# Patient Record
Sex: Male | Born: 1980 | Race: White | Hispanic: No | Marital: Married | Smoking: Never smoker
Health system: Southern US, Community
[De-identification: ages and names within clinical notes are randomized; demographics above are authoritative.]

## PROBLEM LIST (undated history)

## (undated) DIAGNOSIS — K6389 Other specified diseases of intestine: Secondary | ICD-10-CM

## (undated) DIAGNOSIS — T7840XA Allergy, unspecified, initial encounter: Secondary | ICD-10-CM

## (undated) DIAGNOSIS — Z8781 Personal history of (healed) traumatic fracture: Secondary | ICD-10-CM

## (undated) HISTORY — DX: Allergy, unspecified, initial encounter: T78.40XA

## (undated) HISTORY — DX: Personal history of (healed) traumatic fracture: Z87.81

## (undated) HISTORY — PX: VASECTOMY: SHX75

---
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
## 2003-09-25 ENCOUNTER — Emergency Department (HOSPITAL_COMMUNITY): Admission: EM | Admit: 2003-09-25 | Discharge: 2003-09-25 | Payer: Self-pay | Admitting: Emergency Medicine

## 2011-10-26 DIAGNOSIS — Z8781 Personal history of (healed) traumatic fracture: Secondary | ICD-10-CM

## 2011-10-26 HISTORY — PX: FRACTURE SURGERY: SHX138

## 2011-10-26 HISTORY — DX: Personal history of (healed) traumatic fracture: Z87.81

## 2012-03-02 ENCOUNTER — Emergency Department (HOSPITAL_COMMUNITY): Payer: 59

## 2012-03-02 ENCOUNTER — Encounter (HOSPITAL_COMMUNITY): Payer: Self-pay | Admitting: *Deleted

## 2012-03-02 ENCOUNTER — Emergency Department (HOSPITAL_COMMUNITY)
Admission: EM | Admit: 2012-03-02 | Discharge: 2012-03-03 | Disposition: A | Discharge status: Home / Self Care | Payer: 59 | Attending: Emergency Medicine | Admitting: Emergency Medicine

## 2012-03-02 DIAGNOSIS — M25559 Pain in unspecified hip: Secondary | ICD-10-CM | POA: Insufficient documentation

## 2012-03-02 DIAGNOSIS — IMO0002 Reserved for concepts with insufficient information to code with codable children: Secondary | ICD-10-CM | POA: Insufficient documentation

## 2012-03-02 DIAGNOSIS — R0602 Shortness of breath: Secondary | ICD-10-CM | POA: Insufficient documentation

## 2012-03-02 DIAGNOSIS — M25519 Pain in unspecified shoulder: Secondary | ICD-10-CM | POA: Insufficient documentation

## 2012-03-02 DIAGNOSIS — S42002A Fracture of unspecified part of left clavicle, initial encounter for closed fracture: Secondary | ICD-10-CM

## 2012-03-02 DIAGNOSIS — S42023A Displaced fracture of shaft of unspecified clavicle, initial encounter for closed fracture: Secondary | ICD-10-CM | POA: Insufficient documentation

## 2012-03-02 DIAGNOSIS — T07XXXA Unspecified multiple injuries, initial encounter: Secondary | ICD-10-CM

## 2012-03-02 LAB — POCT I-STAT, CHEM 8
BUN: 18 mg/dL (ref 6–23)
Calcium, Ion: 1.15 mmol/L (ref 1.12–1.32)
Chloride: 104 mEq/L (ref 96–112)
Creatinine, Ser: 1.2 mg/dL (ref 0.50–1.35)
Glucose, Bld: 116 mg/dL — ABNORMAL HIGH (ref 70–99)
HCT: 47 % (ref 39.0–52.0)
Hemoglobin: 16 g/dL (ref 13.0–17.0)
Potassium: 4.5 mEq/L (ref 3.5–5.1)
Sodium: 139 mEq/L (ref 135–145)
TCO2: 26 mmol/L (ref 0–100)

## 2012-03-02 LAB — SAMPLE TO BLOOD BANK

## 2012-03-02 MED ORDER — HYDROCODONE-ACETAMINOPHEN 5-325 MG PO TABS: 1.0000 | ORAL_TABLET | ORAL | Status: AC | PRN

## 2012-03-02 MED ORDER — TETANUS-DIPHTH-ACELL PERTUSSIS 5-2.5-18.5 LF-MCG/0.5 IM SUSP
0.5000 mL | Freq: Once | INTRAMUSCULAR | Status: AC
  Administered 2012-03-02: 0.5 mL via INTRAMUSCULAR
  Filled 2012-03-02: qty 0.5

## 2012-03-02 MED ORDER — MORPHINE SULFATE 4 MG/ML IJ SOLN
4.0000 mg | INTRAMUSCULAR | Status: AC
  Administered 2012-03-02: 4 mg via INTRAVENOUS
  Filled 2012-03-02: qty 1

## 2012-03-02 MED ORDER — SODIUM CHLORIDE 0.9 % IV BOLUS (SEPSIS)
1000.0000 mL | INTRAVENOUS | Status: AC
  Administered 2012-03-02: 1000 mL via INTRAVENOUS

## 2012-03-02 MED ORDER — HYDROCODONE-ACETAMINOPHEN 5-325 MG PO TABS
1.0000 | ORAL_TABLET | ORAL | Status: AC
  Administered 2012-03-02: 1 via ORAL
  Filled 2012-03-02: qty 1
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
## 2012-03-02 NOTE — ED Notes (Signed)
Pt with syncopal episode in xray. Pt lifted left arm for xray and states had extreme pain. RN was called to xray and pt was alert and oriented x 3. Staff states pt did not fall to the ground. Pulse 69, pt mildly diaphoretic, blood pressure 110/75. RES made aware.
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
## 2012-03-02 NOTE — ED Notes (Signed)
Ortho on their way 

## 2012-03-02 NOTE — Progress Notes (Signed)
Chaplain Note:  Chaplain responded to LV2 trauma page received at 15:20.  Chaplain provided spiritual comfort and support for pt.  At pt's request, chaplain phoned pt's father to inform him that pt was being treated at [facility].  Pt expressed appreciation for chaplain support.  Chaplain will follow up as needed.  03/02/12 1600  Clinical Encounter Type  Visited With Patient  Visit Type Spiritual support;Initial;ED  Referral From Other (Comment) (Trauma Page)  Spiritual Encounters  Spiritual Needs Emotional  Stress Factors  Patient Stress Factors Health changes  Family Stress Factors Lack of knowledge;Loss of control   Verdie Shire, chaplain resident [phone]

## 2012-03-02 NOTE — Progress Notes (Signed)
Orthopedic Tech Progress Note Patient Details:  William Livingston 03/10/1981 [id number]  Other Ortho Devices Type of Ortho Device: Other (comment) (shoulder immobilizer) Ortho Device Location: (L) UE Ortho Device Interventions: Application   Jennye Moccasin 03/02/2012, 6:19 PM

## 2012-03-05 NOTE — ED Provider Notes (Signed)
I saw and evaluated the patient, reviewed the resident's note and I agree with the findings and plan.   Dione Booze, MD 03/05/12 [phone]

## 2013-07-12 ENCOUNTER — Ambulatory Visit: Payer: PRIVATE HEALTH INSURANCE | Admitting: Physician Assistant

## 2013-07-12 VITALS — BP 110/70 | HR 57 | Temp 97.9°F | Resp 16 | Ht 71.0 in | Wt 173.0 lb

## 2013-07-12 DIAGNOSIS — S51809A Unspecified open wound of unspecified forearm, initial encounter: Secondary | ICD-10-CM

## 2013-07-12 DIAGNOSIS — S51801A Unspecified open wound of right forearm, initial encounter: Secondary | ICD-10-CM
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
## 2013-07-12 NOTE — Patient Instructions (Signed)
Wash the wound daily with soap and water. Return for re-evaluation if you develop increased pain, redness, swelling or drainage from the wound, or if you develop a fever above 101.

## 2014-04-24 ENCOUNTER — Telehealth: Payer: Self-pay

## 2014-04-24 NOTE — Telephone Encounter (Signed)
Patient called yesterday and left a message regarding his records that he requested. I returned patient's call and advised him that his request was processed on Friday 6/25 and that the records were mailed to him. Patient understood.

## 2019-06-26 DIAGNOSIS — K6389 Other specified diseases of intestine: Secondary | ICD-10-CM

## 2019-06-26 HISTORY — DX: Other specified diseases of intestine: K63.89

## 2019-07-17 ENCOUNTER — Encounter (HOSPITAL_COMMUNITY): Payer: Self-pay

## 2019-07-17 ENCOUNTER — Other Ambulatory Visit: Payer: Self-pay

## 2019-07-17 ENCOUNTER — Emergency Department (HOSPITAL_COMMUNITY): Payer: Managed Care, Other (non HMO)

## 2019-07-17 ENCOUNTER — Inpatient Hospital Stay (HOSPITAL_COMMUNITY)
Admission: EM | Admit: 2019-07-17 | Discharge: 2019-07-31 | DRG: 330 | Disposition: A | Discharge status: Home / Self Care | Payer: Managed Care, Other (non HMO) | Attending: Surgery | Admitting: Surgery

## 2019-07-17 DIAGNOSIS — D649 Anemia, unspecified: Secondary | ICD-10-CM

## 2019-07-17 DIAGNOSIS — K9189 Other postprocedural complications and disorders of digestive system: Secondary | ICD-10-CM | POA: Diagnosis not present

## 2019-07-17 DIAGNOSIS — R5383 Other fatigue: Secondary | ICD-10-CM | POA: Diagnosis present

## 2019-07-17 DIAGNOSIS — K6389 Other specified diseases of intestine: Secondary | ICD-10-CM

## 2019-07-17 DIAGNOSIS — K621 Rectal polyp: Secondary | ICD-10-CM | POA: Diagnosis present

## 2019-07-17 DIAGNOSIS — Z20828 Contact with and (suspected) exposure to other viral communicable diseases: Secondary | ICD-10-CM | POA: Diagnosis present

## 2019-07-17 DIAGNOSIS — K56600 Partial intestinal obstruction, unspecified as to cause: Secondary | ICD-10-CM | POA: Diagnosis present

## 2019-07-17 DIAGNOSIS — Z539 Procedure and treatment not carried out, unspecified reason: Secondary | ICD-10-CM | POA: Diagnosis present

## 2019-07-17 DIAGNOSIS — C183 Malignant neoplasm of hepatic flexure: Secondary | ICD-10-CM | POA: Diagnosis present

## 2019-07-17 DIAGNOSIS — R000 Tachycardia, unspecified: Secondary | ICD-10-CM | POA: Diagnosis present

## 2019-07-17 DIAGNOSIS — Z5331 Laparoscopic surgical procedure converted to open procedure: Secondary | ICD-10-CM | POA: Diagnosis not present

## 2019-07-17 DIAGNOSIS — R590 Localized enlarged lymph nodes: Secondary | ICD-10-CM | POA: Diagnosis present

## 2019-07-17 DIAGNOSIS — K567 Ileus, unspecified: Secondary | ICD-10-CM | POA: Diagnosis not present

## 2019-07-17 DIAGNOSIS — K8020 Calculus of gallbladder without cholecystitis without obstruction: Secondary | ICD-10-CM | POA: Diagnosis present

## 2019-07-17 DIAGNOSIS — R339 Retention of urine, unspecified: Secondary | ICD-10-CM | POA: Diagnosis present

## 2019-07-17 DIAGNOSIS — D62 Acute posthemorrhagic anemia: Secondary | ICD-10-CM | POA: Diagnosis present

## 2019-07-17 DIAGNOSIS — K648 Other hemorrhoids: Secondary | ICD-10-CM | POA: Diagnosis present

## 2019-07-17 DIAGNOSIS — Z881 Allergy status to other antibiotic agents status: Secondary | ICD-10-CM | POA: Diagnosis not present

## 2019-07-17 DIAGNOSIS — D509 Iron deficiency anemia, unspecified: Secondary | ICD-10-CM | POA: Diagnosis present

## 2019-07-17 DIAGNOSIS — R140 Abdominal distension (gaseous): Secondary | ICD-10-CM

## 2019-07-17 DIAGNOSIS — R911 Solitary pulmonary nodule: Secondary | ICD-10-CM | POA: Diagnosis present

## 2019-07-17 DIAGNOSIS — R42 Dizziness and giddiness: Secondary | ICD-10-CM | POA: Diagnosis present

## 2019-07-17 DIAGNOSIS — C182 Malignant neoplasm of ascending colon: Principal | ICD-10-CM | POA: Diagnosis present

## 2019-07-17 DIAGNOSIS — I951 Orthostatic hypotension: Secondary | ICD-10-CM

## 2019-07-17 LAB — COMPREHENSIVE METABOLIC PANEL
ALT: 13 U/L (ref 0–44)
AST: 15 U/L (ref 15–41)
Albumin: 3.5 g/dL (ref 3.5–5.0)
Alkaline Phosphatase: 76 U/L (ref 38–126)
Anion gap: 13 (ref 5–15)
BUN: 8 mg/dL (ref 6–20)
CO2: 24 mmol/L (ref 22–32)
Calcium: 8.9 mg/dL (ref 8.9–10.3)
Chloride: 99 mmol/L (ref 98–111)
Creatinine, Ser: 0.95 mg/dL (ref 0.61–1.24)
GFR calc Af Amer: >60 mL/min (ref >60)
GFR calc non Af Amer: >60 mL/min (ref >60)
Glucose, Bld: 117 mg/dL — ABNORMAL HIGH (ref 70–99)
Potassium: 4.3 mmol/L (ref 3.5–5.1)
Sodium: 136 mmol/L (ref 135–145)
Total Bilirubin: 0.7 mg/dL (ref 0.3–1.2)
Total Protein: 7.2 g/dL (ref 6.5–8.1)

## 2019-07-17 LAB — FERRITIN: Ferritin: 12 ng/mL — ABNORMAL LOW (ref 24–336)

## 2019-07-17 LAB — URINALYSIS, ROUTINE W REFLEX MICROSCOPIC
Bilirubin Urine: NEGATIVE
Glucose, UA: NEGATIVE mg/dL
Hgb urine dipstick: NEGATIVE
Ketones, ur: 20 mg/dL — AB
Leukocytes,Ua: NEGATIVE
Nitrite: NEGATIVE
Protein, ur: NEGATIVE mg/dL
Specific Gravity, Urine: >1.046 — ABNORMAL HIGH (ref 1.005–1.030)
pH: 6 (ref 5.0–8.0)

## 2019-07-17 LAB — CBC WITH DIFFERENTIAL/PLATELET
Abs Immature Granulocytes: 0.08 10*3/uL — ABNORMAL HIGH (ref 0.00–0.07)
Basophils Absolute: 0 10*3/uL (ref 0.0–0.1)
Basophils Relative: 0 %
Eosinophils Absolute: 0.1 10*3/uL (ref 0.0–0.5)
Eosinophils Relative: 0 %
HCT: 29.6 % — ABNORMAL LOW (ref 39.0–52.0)
Hemoglobin: 8.3 g/dL — ABNORMAL LOW (ref 13.0–17.0)
Immature Granulocytes: 1 %
Lymphocytes Relative: 6 %
Lymphs Abs: 0.8 10*3/uL (ref 0.7–4.0)
MCH: 17.2 pg — ABNORMAL LOW (ref 26.0–34.0)
MCHC: 28 g/dL — ABNORMAL LOW (ref 30.0–36.0)
MCV: 61.4 fL — ABNORMAL LOW (ref 80.0–100.0)
Monocytes Absolute: 1.2 10*3/uL — ABNORMAL HIGH (ref 0.1–1.0)
Monocytes Relative: 10 %
Neutro Abs: 9.9 10*3/uL — ABNORMAL HIGH (ref 1.7–7.7)
Neutrophils Relative %: 83 %
Platelets: 656 10*3/uL — ABNORMAL HIGH (ref 150–400)
RBC: 4.82 MIL/uL (ref 4.22–5.81)
RDW: 18.1 % — ABNORMAL HIGH (ref 11.5–15.5)
WBC: 12 10*3/uL — ABNORMAL HIGH (ref 4.0–10.5)
nRBC: 0 % (ref 0.0–0.2)

## 2019-07-17 LAB — CBC
HCT: 30.8 % — ABNORMAL LOW (ref 39.0–52.0)
Hemoglobin: 8.6 g/dL — ABNORMAL LOW (ref 13.0–17.0)
MCH: 17.2 pg — ABNORMAL LOW (ref 26.0–34.0)
MCHC: 27.9 g/dL — ABNORMAL LOW (ref 30.0–36.0)
MCV: 61.6 fL — ABNORMAL LOW (ref 80.0–100.0)
Platelets: 673 10*3/uL — ABNORMAL HIGH (ref 150–400)
RBC: 5 MIL/uL (ref 4.22–5.81)
RDW: 18.4 % — ABNORMAL HIGH (ref 11.5–15.5)
WBC: 12 10*3/uL — ABNORMAL HIGH (ref 4.0–10.5)
nRBC: 0 % (ref 0.0–0.2)

## 2019-07-17 LAB — RETICULOCYTES
Immature Retic Fract: 17.7 % — ABNORMAL HIGH (ref 2.3–15.9)
RBC.: 4.82 MIL/uL (ref 4.22–5.81)
Retic Count, Absolute: 53 10*3/uL (ref 19.0–186.0)
Retic Ct Pct: 1.1 % (ref 0.4–3.1)

## 2019-07-17 LAB — IRON AND TIBC
Iron: 6 ug/dL — ABNORMAL LOW (ref 45–182)
Saturation Ratios: 2 % — ABNORMAL LOW (ref 17.9–39.5)
TIBC: 349 ug/dL (ref 250–450)
UIBC: 343 ug/dL

## 2019-07-17 LAB — ABO/RH: ABO/RH(D): A POS

## 2019-07-17 LAB — FOLATE: Folate: 17.4 ng/mL (ref >5.9)

## 2019-07-17 LAB — PROTIME-INR
INR: 1.1 (ref 0.8–1.2)
Prothrombin Time: 14.5 seconds (ref 11.4–15.2)

## 2019-07-17 LAB — LIPASE, BLOOD: Lipase: 20 U/L (ref 11–51)

## 2019-07-17 LAB — LACTIC ACID, PLASMA: Lactic Acid, Venous: 1 mmol/L (ref 0.5–1.9)

## 2019-07-17 LAB — POC OCCULT BLOOD, ED: Fecal Occult Bld: NEGATIVE

## 2019-07-17 LAB — VITAMIN B12: Vitamin B-12: 120 pg/mL — ABNORMAL LOW (ref 180–914)

## 2019-07-17 LAB — SARS CORONAVIRUS 2 BY RT PCR (HOSPITAL ORDER, PERFORMED IN ~~LOC~~ HOSPITAL LAB): SARS Coronavirus 2: NEGATIVE

## 2019-07-17 MED ORDER — ACETAMINOPHEN 650 MG RE SUPP: 650.0000 mg | Freq: Four times a day (QID) | RECTAL | Status: DC | PRN

## 2019-07-17 MED ORDER — IOHEXOL 300 MG/ML  SOLN
100.0000 mL | Freq: Once | INTRAMUSCULAR | Status: AC | PRN
  Administered 2019-07-17: 15:00:00 100 mL via INTRAVENOUS

## 2019-07-17 MED ORDER — PIPERACILLIN-TAZOBACTAM 3.375 G IVPB
3.3750 g | Freq: Three times a day (TID) | INTRAVENOUS | Status: DC
  Administered 2019-07-18 – 2019-07-20 (×8): 3.375 g via INTRAVENOUS
  Filled 2019-07-17 (×8): qty 50

## 2019-07-17 MED ORDER — ONDANSETRON 4 MG PO TBDP: 4.0000 mg | ORAL_TABLET | Freq: Four times a day (QID) | ORAL | Status: DC | PRN

## 2019-07-17 MED ORDER — PIPERACILLIN-TAZOBACTAM 3.375 G IVPB 30 MIN
3.3750 g | Freq: Once | INTRAVENOUS | Status: AC
  Administered 2019-07-17: 20:00:00 3.375 g via INTRAVENOUS
  Filled 2019-07-17: qty 50

## 2019-07-17 MED ORDER — SODIUM CHLORIDE 0.9 % IV BOLUS
1000.0000 mL | Freq: Once | INTRAVENOUS | Status: AC
  Administered 2019-07-17: 1000 mL via INTRAVENOUS

## 2019-07-17 MED ORDER — HYDROMORPHONE HCL 1 MG/ML IJ SOLN
0.5000 mg | INTRAMUSCULAR | Status: DC | PRN
  Administered 2019-07-17 – 2019-07-23 (×16): 0.5 mg via INTRAVENOUS
  Filled 2019-07-17 (×16): qty 1

## 2019-07-17 MED ORDER — ONDANSETRON HCL 4 MG/2ML IJ SOLN
4.0000 mg | Freq: Four times a day (QID) | INTRAMUSCULAR | Status: DC | PRN
  Administered 2019-07-27 – 2019-07-28 (×4): 4 mg via INTRAVENOUS
  Filled 2019-07-17 (×4): qty 2

## 2019-07-17 MED ORDER — SODIUM CHLORIDE 0.9 % IV BOLUS
1000.0000 mL | Freq: Once | INTRAVENOUS | Status: AC
  Administered 2019-07-17: 13:00:00 1000 mL via INTRAVENOUS

## 2019-07-17 MED ORDER — ENOXAPARIN SODIUM 40 MG/0.4ML ~~LOC~~ SOLN
40.0000 mg | SUBCUTANEOUS | Status: DC
  Administered 2019-07-19 – 2019-07-31 (×12): 40 mg via SUBCUTANEOUS
  Filled 2019-07-17 (×14): qty 0.4

## 2019-07-17 MED ORDER — ACETAMINOPHEN 325 MG PO TABS: 650.0000 mg | ORAL_TABLET | Freq: Four times a day (QID) | ORAL | Status: DC | PRN

## 2019-07-17 MED ORDER — SODIUM CHLORIDE 0.9 % IV SOLN
INTRAVENOUS | Status: DC
  Administered 2019-07-18 – 2019-07-21 (×6): via INTRAVENOUS

## 2019-07-17 MED ORDER — SIMETHICONE 80 MG PO CHEW
40.0000 mg | CHEWABLE_TABLET | Freq: Four times a day (QID) | ORAL | Status: DC | PRN
  Filled 2019-07-17: qty 1

## 2019-07-17 NOTE — ED Triage Notes (Signed)
Pt endorses abd pain for several months, went to GI yesterday and found to have low hgb 7.6. Pt is fatigued, no blood in stool denies n/v/d. HR 142 in triage. Axox4.
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
## 2019-07-18 ENCOUNTER — Encounter (HOSPITAL_COMMUNITY): Payer: Self-pay | Admitting: General Practice

## 2019-07-18 LAB — CBC
HCT: 25.1 % — ABNORMAL LOW (ref 39.0–52.0)
HCT: 25.2 % — ABNORMAL LOW (ref 39.0–52.0)
Hemoglobin: 7 g/dL — ABNORMAL LOW (ref 13.0–17.0)
Hemoglobin: 6.9 g/dL — CL (ref 13.0–17.0)
MCH: 17.1 pg — ABNORMAL LOW (ref 26.0–34.0)
MCH: 16.7 pg — ABNORMAL LOW (ref 26.0–34.0)
MCHC: 27.9 g/dL — ABNORMAL LOW (ref 30.0–36.0)
MCHC: 27.4 g/dL — ABNORMAL LOW (ref 30.0–36.0)
MCV: 61.4 fL — ABNORMAL LOW (ref 80.0–100.0)
MCV: 61.2 fL — ABNORMAL LOW (ref 80.0–100.0)
Platelets: 561 10*3/uL — ABNORMAL HIGH (ref 150–400)
Platelets: 357 10*3/uL (ref 150–400)
RBC: 4.09 MIL/uL — ABNORMAL LOW (ref 4.22–5.81)
RBC: 4.12 MIL/uL — ABNORMAL LOW (ref 4.22–5.81)
RDW: 17.9 % — ABNORMAL HIGH (ref 11.5–15.5)
RDW: 18 % — ABNORMAL HIGH (ref 11.5–15.5)
WBC: 9.8 10*3/uL (ref 4.0–10.5)
WBC: 9.3 10*3/uL (ref 4.0–10.5)
nRBC: 0 % (ref 0.0–0.2)
nRBC: 0 % (ref 0.0–0.2)

## 2019-07-18 LAB — BASIC METABOLIC PANEL
Anion gap: 10 (ref 5–15)
BUN: 8 mg/dL (ref 6–20)
CO2: 23 mmol/L (ref 22–32)
Calcium: 8 mg/dL — ABNORMAL LOW (ref 8.9–10.3)
Chloride: 101 mmol/L (ref 98–111)
Creatinine, Ser: 0.91 mg/dL (ref 0.61–1.24)
GFR calc Af Amer: >60 mL/min (ref >60)
GFR calc non Af Amer: >60 mL/min (ref >60)
Glucose, Bld: 107 mg/dL — ABNORMAL HIGH (ref 70–99)
Potassium: 4.1 mmol/L (ref 3.5–5.1)
Sodium: 134 mmol/L — ABNORMAL LOW (ref 135–145)

## 2019-07-18 LAB — PREPARE RBC (CROSSMATCH)

## 2019-07-18 MED ORDER — SODIUM CHLORIDE 0.9% IV SOLUTION
Freq: Once | INTRAVENOUS | Status: AC
  Administered 2019-07-18: 09:00:00 via INTRAVENOUS

## 2019-07-18 MED ORDER — PEG 3350-KCL-NA BICARB-NACL 420 G PO SOLR
4000.0000 mL | Freq: Once | ORAL | Status: AC
  Administered 2019-07-18: 4000 mL via ORAL
  Filled 2019-07-18: qty 4000

## 2019-07-18 NOTE — ED Notes (Signed)
Paged admitting and told them about new cbc values that were 7.0 from 8.6 and pressures were lower , was advised to recollect cbc to confirm the drop. Nothing for pressures at this time.
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
## 2019-07-18 NOTE — ED Notes (Signed)
Attempted report 

## 2019-07-18 NOTE — ED Notes (Signed)
Paged CCs to Rn Angela--William Livingston

## 2019-07-18 NOTE — ED Notes (Signed)
Diet was ordered for Lunch. 

## 2019-07-18 NOTE — ED Notes (Signed)
Admitting doctor paged regarding patients hgb

## 2019-07-18 NOTE — Progress Notes (Signed)
[facility] Progress Note     Subjective: CC-  Comfortable this morning. Mild RUQ discomfort. Denies n/v.  HR stable 62bpm, BP soft 96/50 CBC pending this morning.  Objective: Vital signs in last 24 hours: Temp:  [98.6 F (37 C)-99.1 F (37.3 C)] 99.1 F (37.3 C) (09/23 0158) Pulse Rate:  [62-142] 62 (09/23 0600) Resp:  [14-26] 16 (09/23 0600) BP: (96-148)/(50-86) 96/50 (09/23 0600) SpO2:  [94 %-100 %] 95 % (09/23 0600) Weight:  [84.4 kg] 84.4 kg (09/22 1800)    Intake/Output from previous day: 09/22 0701 - 09/23 0700 In: 1200 [IV Piggyback:1200] Out: -  Intake/Output this shift: Total I/O In: -  Out: 550 [Urine:550]  PE: Gen:  Alert, NAD, pleasant HEENT: EOM's intact, pupils equal and round Card:  RRR Pulm:  CTAB, no W/R/R, effort normal Abd: Soft, ND, +BS, no HSM, mild subjective RUQ TTP without peritonitis Ext: calves soft and nontender without edema Psych: A&Ox3  Skin: no rashes noted, warm and dry  Lab Results:  Recent Labs    07/17/19 1455 07/18/19 0342  WBC 12.0* 9.8  HGB 8.3* 7.0*  HCT 29.6* 25.1*  PLT 656* 561*   BMET Recent Labs    07/17/19 1142 07/18/19 0342  NA 136 134*  K 4.3 4.1  CL 99 101  CO2 24 23  GLUCOSE 117* 107*  BUN 8 8  CREATININE 0.95 0.91  CALCIUM 8.9 8.0*   PT/INR Recent Labs    07/17/19 1142  LABPROT 14.5  INR 1.1   CMP     Component Value Date/Time   NA 134 (L) 07/18/2019 0342   K 4.1 07/18/2019 0342   CL 101 07/18/2019 0342   CO2 23 07/18/2019 0342   GLUCOSE 107 (H) 07/18/2019 0342   BUN 8 07/18/2019 0342   CREATININE 0.91 07/18/2019 0342   CALCIUM 8.0 (L) 07/18/2019 0342   PROT 7.2 07/17/2019 1142   ALBUMIN 3.5 07/17/2019 1142   AST 15 07/17/2019 1142   ALT 13 07/17/2019 1142   ALKPHOS 76 07/17/2019 1142   BILITOT 0.7 07/17/2019 1142   GFRNONAA >60 07/18/2019 0342   GFRAA >60 07/18/2019 0342   Lipase     Component Value Date/Time   LIPASE 20 07/17/2019 1142        Studies/Results: Ct Abdomen Pelvis W Contrast  Result Date: 07/17/2019 CLINICAL DATA:  Abdominal pain EXAM: CT ABDOMEN AND PELVIS WITH CONTRAST TECHNIQUE: Multidetector CT imaging of the abdomen and pelvis was performed using the standard protocol following bolus administration of intravenous contrast. CONTRAST:  168mL OMNIPAQUE IOHEXOL 300 MG/ML  SOLN COMPARISON:  None. FINDINGS: Lower chest: There is bibasilar atelectasis. There is no lung base edema or consolidation. Hepatobiliary: No focal liver lesions are evident. There is cholelithiasis. There is no appreciable gallbladder wall thickening. There is no appreciable biliary duct dilatation. Pancreas: There is no pancreatic mass or inflammatory focus. Spleen: There are probable small hemangiomas in the mid to inferior spleen, largest measuring approximately 1 cm. No other splenic lesions are evident. Adrenals/Urinary Tract: Adrenals bilaterally appear normal. Kidneys bilaterally show no evident mass or hydronephrosis on either side. There is no evident renal or ureteral calculus on either side. Urinary bladder is midline with wall thickness within normal limits. Stomach/Bowel: There is extensive thickening of the wall of the mid ascending colon with extensive mesenteric thickening surrounding this area of thickening. This area in the mid ascending colon appears masslike, measuring 9.8 x 7.9 x 8.7 cm. There is fluid surrounding this  area of the ascending colon. No similar changes are noted elsewhere in the colon. There are several nearby lymph nodes, largest measuring 1.2 x 1.1 cm. No bowel obstruction is evident. There is no free air or portal venous air. The terminal ileum appears unremarkable. Vascular/Lymphatic: There is no abdominal aortic aneurysm. No vascular lesions are evident. They are is no adenopathy beyond mildly enlarged lymph nodes near the apparent mass in the ascending colon. Reproductive: Prostate and seminal vesicles appear normal  in size and contour. No pelvic masses evident. Other: The appendix appears unremarkable. There is no appreciable abscess in the abdomen or pelvis. A small amount of ascites is noted in the dependent portion of the pelvis. No other ascites evident. Slight fat is noted in the umbilicus. Musculoskeletal: No blastic or lytic bone lesions. No intramuscular lesion. IMPRESSION: 1. Apparent inflammatory mass arising in the mid ascending colon. This mass appears irregular and contour measuring 9.8 x 7.9 x 8.7 cm. There is irregular wall thickening in the ascending colon with surrounding soft tissue stranding and fluid in several prominent lymph nodes. 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 This area warrants direct visualization to further evaluate. 2. No similar masslike area elsewhere within bowel. No bowel obstruction. Small amount of ascites in pelvis which may well be of reactive etiology. Appendix appears normal. 3.  Cholelithiasis.  No gallbladder wall thickening. 4.  No evident liver lesions. 5.  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 Electronically Signed   By: Lowella Grip III M.D.   On: 07/17/2019 15:29    Anti-infectives: Anti-infectives (From admission, onward)   Start     Dose/Rate Route Frequency Ordered Stop   07/18/19 0130  piperacillin-tazobactam (ZOSYN) IVPB 3.375 g     3.375 g 12.5 mL/hr over 240 Minutes Intravenous Every 8 hours 07/17/19 1853     07/17/19 1900  piperacillin-tazobactam (ZOSYN) IVPB 3.375 g     3.375 g 100 mL/hr over 30 Minutes Intravenous  Once 07/17/19 1850 07/17/19 2327       Assessment/Plan Acute blood loss anemia - likely 2/2 to below. Repeat CBC pending. If Hgb <7 will need blood transfusion  Right colon mass - not sure of etiology but this appears to be neoplasm - CEA pending - continue zosyn - Will ask GI to consult for possible colonoscopy. May be beneficial to evaluate his colon with a colonoscope  prior to surgery to ensure no other lesions in the colon. Would not need a biopsy because he will undergo right colectomy during this admission, and would be at risk of bleeding with a biopsy.  Keep NPO for now.  ID - zosyn 9/22>> FEN - IVF, NPO VTE - SCDs, lovenox Foley - none Follow up - TBD   LOS: 1 day    Wellington Hampshire , [facility] 07/18/2019, 7:56 AM Pager: [phone] Mon-Thurs 7:00 am-4:30 pm Fri 7:00 am -11:30 AM Sat-Sun 7:00 am-11:30 am
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
## 2019-07-18 NOTE — Progress Notes (Signed)
Pt arrived from ED to 6N14 AOx4, VSS, with ongoing Blood transfusion and IV antibiotic, ambulatory, wife at bedside, oriented to room, call bell, call light and use of bed controls. Denies pain at this time, will continue to monitor.

## 2019-07-18 NOTE — ED Notes (Signed)
Dr. Wakefield at bedside. 

## 2019-07-19 ENCOUNTER — Inpatient Hospital Stay (HOSPITAL_COMMUNITY): Payer: Managed Care, Other (non HMO) | Admitting: Certified Registered Nurse Anesthetist

## 2019-07-19 ENCOUNTER — Encounter (HOSPITAL_COMMUNITY): Payer: Self-pay

## 2019-07-19 ENCOUNTER — Encounter (HOSPITAL_COMMUNITY): Admission: EM | Disposition: A | Discharge status: Home / Self Care | Payer: Self-pay | Source: Home / Self Care

## 2019-07-19 ENCOUNTER — Inpatient Hospital Stay (HOSPITAL_COMMUNITY): Payer: Managed Care, Other (non HMO)

## 2019-07-19 HISTORY — PX: POLYPECTOMY: SHX5525

## 2019-07-19 HISTORY — PX: SUBMUCOSAL TATTOO INJECTION: SHX6856

## 2019-07-19 HISTORY — PX: BIOPSY: SHX5522

## 2019-07-19 HISTORY — PX: COLONOSCOPY WITH PROPOFOL: SHX5780

## 2019-07-19 LAB — BASIC METABOLIC PANEL
Anion gap: 9 (ref 5–15)
BUN: 6 mg/dL (ref 6–20)
CO2: 23 mmol/L (ref 22–32)
Calcium: 8.3 mg/dL — ABNORMAL LOW (ref 8.9–10.3)
Chloride: 106 mmol/L (ref 98–111)
Creatinine, Ser: 0.86 mg/dL (ref 0.61–1.24)
GFR calc Af Amer: >60 mL/min (ref >60)
GFR calc non Af Amer: >60 mL/min (ref >60)
Glucose, Bld: 96 mg/dL (ref 70–99)
Potassium: 3.9 mmol/L (ref 3.5–5.1)
Sodium: 138 mmol/L (ref 135–145)

## 2019-07-19 LAB — TYPE AND SCREEN
ABO/RH(D): A POS
Antibody Screen: NEGATIVE
Unit division: 0
Unit division: 0
Unit division: 0

## 2019-07-19 LAB — BPAM RBC
Blood Product Expiration Date: 202010182359
Blood Product Expiration Date: 202010182359
Blood Product Expiration Date: 202010192359
ISSUE DATE / TIME: 202009231024
ISSUE DATE / TIME: 202009231509
ISSUE DATE / TIME: 202009232027
Unit Type and Rh: 6200
Unit Type and Rh: 6200
Unit Type and Rh: 6200

## 2019-07-19 LAB — CBC
HCT: 30.5 % — ABNORMAL LOW (ref 39.0–52.0)
Hemoglobin: 8.9 g/dL — ABNORMAL LOW (ref 13.0–17.0)
MCH: 19.3 pg — ABNORMAL LOW (ref 26.0–34.0)
MCHC: 29.2 g/dL — ABNORMAL LOW (ref 30.0–36.0)
MCV: 66 fL — ABNORMAL LOW (ref 80.0–100.0)
Platelets: 501 10*3/uL — ABNORMAL HIGH (ref 150–400)
RBC: 4.62 MIL/uL (ref 4.22–5.81)
RDW: 23.2 % — ABNORMAL HIGH (ref 11.5–15.5)
WBC: 7.4 10*3/uL (ref 4.0–10.5)
nRBC: 0 % (ref 0.0–0.2)

## 2019-07-19 LAB — CEA: CEA: 1.7 ng/mL (ref 0.0–4.7)

## 2019-07-19 SURGERY — COLONOSCOPY WITH PROPOFOL
Anesthesia: Monitor Anesthesia Care

## 2019-07-19 MED ORDER — LIDOCAINE 2% (20 MG/ML) 5 ML SYRINGE
INTRAMUSCULAR | Status: DC | PRN
  Administered 2019-07-19: 80 mg via INTRAVENOUS

## 2019-07-19 MED ORDER — IOHEXOL 300 MG/ML  SOLN
75.0000 mL | Freq: Once | INTRAMUSCULAR | Status: AC | PRN
  Administered 2019-07-19: 75 mL via INTRAVENOUS

## 2019-07-19 MED ORDER — PROPOFOL 500 MG/50ML IV EMUL
INTRAVENOUS | Status: DC | PRN
  Administered 2019-07-19: 75 ug/kg/min via INTRAVENOUS

## 2019-07-19 MED ORDER — PROPOFOL 10 MG/ML IV BOLUS
INTRAVENOUS | Status: DC | PRN
  Administered 2019-07-19 (×2): 20 mg via INTRAVENOUS

## 2019-07-19 MED ORDER — SPOT INK MARKER SYRINGE KIT
PACK | SUBMUCOSAL | Status: DC | PRN
  Administered 2019-07-19: 3 mL via SUBMUCOSAL

## 2019-07-19 SURGICAL SUPPLY — 22 items

## 2019-07-19 NOTE — Anesthesia Postprocedure Evaluation (Signed)
Anesthesia Post Note  Patient: William Livingston  Procedure(s) Performed: COLONOSCOPY WITH PROPOFOL (N/A ) BIOPSY SUBMUCOSAL TATTOO INJECTION POLYPECTOMY     Patient location during evaluation: PACU Anesthesia Type: MAC Level of consciousness: awake and alert Pain management: pain level controlled Vital Signs Assessment: post-procedure vital signs reviewed and stable Respiratory status: spontaneous breathing, nonlabored ventilation and respiratory function stable Cardiovascular status: stable and blood pressure returned to baseline Anesthetic complications: no    Last Vitals:  Vitals:   07/19/19 1445 07/19/19 1455  BP: (!) 120/57 132/67  Pulse: (!) 52 (!) 51  Resp: 14 18  Temp:    SpO2: 100% 99%    Last Pain:  Vitals:   07/19/19 1455  TempSrc:   PainSc: 0-No pain                 Audry Pili

## 2019-07-19 NOTE — Transfer of Care (Signed)
Immediate Anesthesia Transfer of Care Note  Patient: William Livingston  Procedure(s) Performed: COLONOSCOPY WITH PROPOFOL (N/A ) BIOPSY SUBMUCOSAL TATTOO INJECTION POLYPECTOMY  Patient Location: Endoscopy Unit  Anesthesia Type:MAC  Level of Consciousness: drowsy, patient cooperative and responds to stimulation  Airway & Oxygen Therapy: Patient Spontanous Breathing and Patient connected to face mask oxygen  Post-op Assessment: Report given to RN and Post -op Vital signs reviewed and stable  Post vital signs: Reviewed and stable  Last Vitals:  Vitals Value Taken Time  BP    Temp    Pulse    Resp    SpO2      Last Pain:  Vitals:   07/19/19 1323  TempSrc: Temporal  PainSc: 0-No pain         Complications: No apparent anesthesia complications

## 2019-07-19 NOTE — TOC Initial Note (Signed)
Transition of Care [facility]) - Initial/Assessment Note    Patient Details  Name: William Livingston MRN: [id number] Date of Birth: [birth year]  [facility] CM/SW Contact:    Marilu Favre, RN Phone Number: 07/19/2019, 11:56 AM  Clinical Narrative:                  Spoke to patient and wife at bedside. Confirmed face sheet information.   Patient does not have a PCP. Explained he can call number on his insurance card for a complete list of MD's in network.   Patient plans to call his wife's PCP , Dr Anastasia Pall to make an appointment to establish care .  Will continue to follow for discharge needs.    Barriers to Discharge: Continued Medical Work up   Patient Goals and CMS Choice Patient states their goals for this hospitalization and ongoing recovery are:: to return to home CMS Medicare.gov Compare Post Acute Care list provided to:: Patient    Expected Discharge Plan and Services         Living arrangements for the past 2 months: Single Family Home                 DME Arranged: N/A         HH Arranged: NA          Prior Living Arrangements/Services Living arrangements for the past 2 months: Single Family Home Lives with:: Spouse Patient language and need for interpreter reviewed:: Yes Do you feel safe going back to the place where you live?: Yes      Need for Family Participation in Patient Care: No (Comment) Care giver support system in place?: Yes (comment)   Criminal Activity/Legal Involvement Pertinent to Current Situation/Hospitalization: No - Comment as needed  Activities of Daily Living Home Assistive Devices/Equipment: None ADL Screening (condition at time of admission) Patient's cognitive ability adequate to safely complete daily activities?: Yes Is the patient deaf or have difficulty hearing?: No Does the patient have difficulty seeing, even when wearing glasses/contacts?: No Does the patient have difficulty concentrating,  remembering, or making decisions?: No Patient able to express need for assistance with ADLs?: Yes Does the patient have difficulty dressing or bathing?: No Independently performs ADLs?: Yes (appropriate for developmental age) Does the patient have difficulty walking or climbing stairs?: No Weakness of Legs: None Weakness of Arms/Hands: None  Permission Sought/Granted                  Emotional Assessment Appearance:: Appears stated age Attitude/Demeanor/Rapport: Engaged Affect (typically observed): Accepting Orientation: : Oriented to Self, Oriented to Place, Oriented to  Time, Oriented to Situation Alcohol / Substance Use: Not Applicable Psych Involvement: No (comment)  Admission diagnosis:  Tachycardia [R00.0] Orthostasis [I95.1] Colonic mass [K63.89] Anemia, unspecified type [D64.9] Patient Active Problem List   Diagnosis Date Noted  . Mass of colon 07/17/2019   PCP:  Patient, No Pcp Per Pharmacy:   [facility] [id number], [address] AT [facility] [address] [place] [address] Phone: [phone] Fax: [phone]     Social Determinants of Health ([place]) Interventions    Readmission Risk Interventions No flowsheet data found.

## 2019-07-19 NOTE — Op Note (Signed)
[facility] Patient Name: William Livingston Procedure Date : 07/19/2019 MRN: [id number] Attending MD: Otis Brace , MD Date of Birth: 1981-05-25 CSN: [id number] Age: 38 Admit Type: Inpatient Procedure:                Colonoscopy Indications:              Iron deficiency anemia, Abnormal CT of the GI                            tract- Right colon mass Providers:                Otis Brace, MD, Carlyn Reichert, RN, Marguerita Merles, Technician, Lazaro Arms, Technician Referring MD:              Medicines:                Sedation Administered by an Anesthesia Professional Complications:            No immediate complications. Estimated Blood Loss:     Estimated blood loss was minimal. Procedure:                Pre-Anesthesia Assessment:                           - Prior to the procedure, a History and Physical                            was performed, and patient medications and                            allergies were reviewed. The patient's tolerance of                            previous anesthesia was also reviewed. The risks                            and benefits of the procedure and the sedation                            options and risks were discussed with the patient.                            All questions were answered, and informed consent                            was obtained. Prior Anticoagulants: The patient has                            taken no previous anticoagulant or antiplatelet                            agents. ASA Grade Assessment: I - A normal, healthy  patient. After reviewing the risks and benefits,                            the patient was deemed in satisfactory condition to                            undergo the procedure.                           After obtaining informed consent, the colonoscope                            was passed under direct vision. Throughout the             procedure, the patient's blood pressure, pulse, and                            oxygen saturations were monitored continuously. The                            [id number] Olympus pediatric colonscope                            was introduced through the anus with the intention                            of advancing to the cecum. The scope was advanced                            to the ascending colon before the procedure was                            aborted. Medications were given. The colonoscopy                            was technically difficult and complex due to a                            partially obstructing mass. The patient tolerated                            the procedure well. The quality of the bowel                            preparation was good. Scope In: 2:06:48 PM Scope Out: 2:25:24 PM Scope Withdrawal Time: 0 hours 8 minutes 8 seconds  Total Procedure Duration: 0 hours 18 minutes 36 seconds  Findings:      The perianal and digital rectal examinations were normal.      A frond-like/villous, fungating, infiltrative and ulcerated partially       obstructing large mass was found at the hepatic flexure and in the       ascending colon. The mass was circumferential. Oozing was present.       Mucosa was biopsied with a cold forceps for histology. Area was tattooed  with an injection of dye.      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      A 2 mm polyp was found in the sigmoid colon. The polyp was removed with       a cold biopsy forceps. Resection and retrieval were complete.      A 5 mm polyp was found in the rectum. The polyp was sessile. The polyp       was removed with a cold snare. Resection and retrieval were complete.      Internal hemorrhoids were found during retroflexion. The hemorrhoids       were small. 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                            Biopsied. Tattooed.                           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                           - One 2 mm polyp in the sigmoid colon, removed with                            a cold biopsy forceps. Resected and retrieved.                           - One 5 mm polyp in the rectum, removed with a cold                            snare. Resected and retrieved.                           - Internal hemorrhoids. Recommendation:           - Return patient to hospital ward for ongoing care.                           - Full liquid diet.                           - Continue present medications.                           - Await pathology results. Procedure Code(s):        --- Professional ---                           [phone], 52, Colonoscopy, flexible; with removal of                            tumor(s), polyp(s), or other lesion(s)  by snare                            technique                           45381, 52, Colonoscopy, flexible; with directed                            submucosal injection(s), any substance                           45380, 59,52, Colonoscopy, flexible; with biopsy,                            single or multiple Diagnosis Code(s):        --- Professional ---                           D49.0, Neoplasm of unspecified behavior of                            digestive system                           K56.690, Other partial intestinal obstruction                           K56.699, Other intestinal obstruction unspecified                            as to partial versus complete obstruction                           K63.5, Polyp of colon                           K62.1, Rectal polyp                           K64.8, Other hemorrhoids                           D50.9, Iron deficiency anemia, unspecified                           R93.3,  Abnormal findings on diagnostic imaging of                            other parts of digestive tract CPT copyright 2019 American Medical Association. All rights reserved. The codes documented in this report are preliminary and upon coder review may  be revised to meet current compliance requirements. Otis Brace, MD Otis Brace, MD 07/19/2019 2:44:04 PM Number of Addenda: 0

## 2019-07-19 NOTE — Anesthesia Preprocedure Evaluation (Addendum)
Anesthesia Evaluation  Patient identified by MRN, date of birth, ID band Patient awake    Reviewed: Allergy & Precautions, NPO status , Patient's Chart, lab work & pertinent test results  History of Anesthesia Complications Negative for: history of anesthetic complications  Airway Mallampati: I  TM Distance: >3 FB Neck ROM: Full    Dental  (+) Dental Advisory Given, Teeth Intact   Pulmonary neg pulmonary ROS,    Pulmonary exam normal        Cardiovascular negative cardio ROS Normal cardiovascular exam     Neuro/Psych negative neurological ROS  negative psych ROS   GI/Hepatic Neg liver ROS,  Colon mass    Endo/Other  negative endocrine ROS  Renal/GU negative Renal ROS     Musculoskeletal negative musculoskeletal ROS (+)   Abdominal   Peds  Hematology  (+) anemia ,   Anesthesia Other Findings   Reproductive/Obstetrics                            Anesthesia Physical Anesthesia Plan  ASA: I  Anesthesia Plan: MAC   Post-op Pain Management:    Induction: Intravenous  PONV Risk Score and Plan: 1 and Propofol infusion and Treatment may vary due to age or medical condition  Airway Management Planned: Nasal Cannula and Natural Airway  Additional Equipment: None  Intra-op Plan:   Post-operative Plan:   Informed Consent: I have reviewed the patients History and Physical, chart, labs and discussed the procedure including the risks, benefits and alternatives for the proposed anesthesia with the patient or authorized representative who has indicated his/her understanding and acceptance.       Plan Discussed with: CRNA and Anesthesiologist  Anesthesia Plan Comments:        Anesthesia Quick Evaluation

## 2019-07-19 NOTE — Progress Notes (Signed)
[facility] Progress Note  Day of Surgery  Subjective: CC-  Feeling ok today. Abdomen a little sore but no pain. Multiple BMs since taking in bowel prep yesterday. Hgb 8.9 from 6.9 after 2 units PRBCs.  Objective: Vital signs in last 24 hours: Temp:  [98.2 F (36.8 C)-99.5 F (37.5 C)] 98.4 F (36.9 C) (09/24 0436) Pulse Rate:  [60-88] 62 (09/24 0436) Resp:  [15-24] 16 (09/24 0436) BP: (110-137)/(52-69) 119/62 (09/24 0436) SpO2:  [96 %-99 %] 98 % (09/24 0436) Weight:  [82.1 kg] 82.1 kg (09/23 1336) Last BM Date: 07/19/19  Intake/Output from previous day: 09/23 0701 - 09/24 0700 In: 6403.5 [P.O.:3000; I.V.:2690.7; Blood:582; IV Piggyback:130.8] Out: 550 [Urine:550] Intake/Output this shift: No intake/output data recorded.  PE: Gen:  Alert, NAD, pleasant HEENT: EOM's intact, pupils equal and round Card:  RRR Pulm:  CTAB, no W/R/R, effort normal Abd: Soft, ND, +BS, no HSM, nontender Psych: A&Ox3  Skin: warm and dry   Lab Results:  Recent Labs    07/18/19 0629 07/19/19 0400  WBC 9.3 7.4  HGB 6.9* 8.9*  HCT 25.2* 30.5*  PLT 357 501*   BMET Recent Labs    07/18/19 0342 07/19/19 0400  NA 134* 138  K 4.1 3.9  CL 101 106  CO2 23 23  GLUCOSE 107* 96  BUN 8 6  CREATININE 0.91 0.86  CALCIUM 8.0* 8.3*   PT/INR Recent Labs    07/17/19 1142  LABPROT 14.5  INR 1.1   CMP     Component Value Date/Time   NA 138 07/19/2019 0400   K 3.9 07/19/2019 0400   CL 106 07/19/2019 0400   CO2 23 07/19/2019 0400   GLUCOSE 96 07/19/2019 0400   BUN 6 07/19/2019 0400   CREATININE 0.86 07/19/2019 0400   CALCIUM 8.3 (L) 07/19/2019 0400   PROT 7.2 07/17/2019 1142   ALBUMIN 3.5 07/17/2019 1142   AST 15 07/17/2019 1142   ALT 13 07/17/2019 1142   ALKPHOS 76 07/17/2019 1142   BILITOT 0.7 07/17/2019 1142   GFRNONAA >60 07/19/2019 0400   GFRAA >60 07/19/2019 0400   Lipase     Component Value Date/Time   LIPASE 20 07/17/2019 1142        Studies/Results: Ct Abdomen Pelvis W Contrast  Result Date: 07/17/2019 CLINICAL DATA:  Abdominal pain EXAM: CT ABDOMEN AND PELVIS WITH CONTRAST TECHNIQUE: Multidetector CT imaging of the abdomen and pelvis was performed using the standard protocol following bolus administration of intravenous contrast. CONTRAST:  170mL OMNIPAQUE IOHEXOL 300 MG/ML  SOLN COMPARISON:  None. FINDINGS: Lower chest: There is bibasilar atelectasis. There is no lung base edema or consolidation. Hepatobiliary: No focal liver lesions are evident. There is cholelithiasis. There is no appreciable gallbladder wall thickening. There is no appreciable biliary duct dilatation. Pancreas: There is no pancreatic mass or inflammatory focus. Spleen: There are probable small hemangiomas in the mid to inferior spleen, largest measuring approximately 1 cm. No other splenic lesions are evident. Adrenals/Urinary Tract: Adrenals bilaterally appear normal. Kidneys bilaterally show no evident mass or hydronephrosis on either side. There is no evident renal or ureteral calculus on either side. Urinary bladder is midline with wall thickness within normal limits. Stomach/Bowel: There is extensive thickening of the wall of the mid ascending colon with extensive mesenteric thickening surrounding this area of thickening. This area in the mid ascending colon appears masslike, measuring 9.8 x 7.9 x 8.7 cm. There is fluid surrounding this area of the ascending colon. No similar  changes are noted elsewhere in the colon. There are several nearby lymph nodes, largest measuring 1.2 x 1.1 cm. No bowel obstruction is evident. There is no free air or portal venous air. The terminal ileum appears unremarkable. Vascular/Lymphatic: There is no abdominal aortic aneurysm. No vascular lesions are evident. They are is no adenopathy beyond mildly enlarged lymph nodes near the apparent mass in the ascending colon. Reproductive: Prostate and seminal vesicles appear normal  in size and contour. No pelvic masses evident. Other: The appendix appears unremarkable. There is no appreciable abscess in the abdomen or pelvis. A small amount of ascites is noted in the dependent portion of the pelvis. No other ascites evident. Slight fat is noted in the umbilicus. Musculoskeletal: No blastic or lytic bone lesions. No intramuscular lesion. IMPRESSION: 1. Apparent inflammatory mass arising in the mid ascending colon. This mass appears irregular and contour measuring 9.8 x 7.9 x 8.7 cm. There is irregular wall thickening in the ascending colon with surrounding soft tissue stranding and fluid in several prominent lymph nodes. 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 This area warrants direct visualization to further evaluate. 2. No similar masslike area elsewhere within bowel. No bowel obstruction. Small amount of ascites in pelvis which may well be of reactive etiology. Appendix appears normal. 3.  Cholelithiasis.  No gallbladder wall thickening. 4.  No evident liver lesions. 5.  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 Electronically Signed   By: Lowella Grip III M.D.   On: 07/17/2019 15:29    Anti-infectives: Anti-infectives (From admission, onward)   Start     Dose/Rate Route Frequency Ordered Stop   07/18/19 0130  piperacillin-tazobactam (ZOSYN) IVPB 3.375 g     3.375 g 12.5 mL/hr over 240 Minutes Intravenous Every 8 hours 07/17/19 1853     07/17/19 1900  piperacillin-tazobactam (ZOSYN) IVPB 3.375 g     3.375 g 100 mL/hr over 30 Minutes Intravenous  Once 07/17/19 1850 07/17/19 2327       Assessment/Plan Acute blood loss anemia - Hgb 8.9 today from 6.9 after 2 units PRBCs 9/23. 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 Alessandra Bevels. Will decide surgical plan after colonoscopy.   ID - zosyn 9/22>> FEN - IVF, NPO  for procedure VTE - SCDs, lovenox Foley - none Follow up - TBD   LOS: 2 days    Wellington Hampshire , [facility] 07/19/2019, 8:51 AM Pager: [phone] Mon-Thurs 7:00 am-4:30 pm Fri 7:00 am -11:30 AM Sat-Sun 7:00 am-11:30 am

## 2019-07-19 NOTE — Interval H&P Note (Signed)
History and Physical Interval Note:  07/19/2019 1:44 PM  William Livingston  has presented today for surgery, with the diagnosis of colon mass, anemia.  The various methods of treatment have been discussed with the patient and family. After consideration of risks, benefits and other options for treatment, the patient has consented to  Procedure(s): COLONOSCOPY WITH PROPOFOL (N/A) as a surgical intervention.  The patient's history has been reviewed, patient examined, no change in status, stable for surgery.  I have reviewed the patient's chart and labs.  Questions were answered to the patient's satisfaction.     Samarra Ridgely

## 2019-07-19 NOTE — Brief Op Note (Signed)
07/17/2019 - 07/19/2019  2:45 PM  PATIENT:  William Livingston  38 y.o. male  PRE-OPERATIVE DIAGNOSIS:  colon mass, anemia  POST-OPERATIVE DIAGNOSIS:  sigmoid colon polyp removed with biopsy forcep, ascending colon mass biopsies, spot injected, rectum polyp removed cold snare   PROCEDURE:  Procedure(s): COLONOSCOPY WITH PROPOFOL (N/A) BIOPSY SUBMUCOSAL TATTOO INJECTION POLYPECTOMY  SURGEON:  Surgeon(s) and Role:    * Cuinn Westerhold, MD - Primary  Findings --------- -Colonoscopy showed large mass extending from hepatic flexure all the way up to proximal ascending colon with significant stenosis at proximal ascending colon.  Not able to advance scope up to the cecum.  Multiple biopsies taken.  Tattoo performed. -Colonoscopy also showed 2 small sigmoid and rectal polyp which were removed.  Recommendations ---------------------------- -Follow biopsy results. -Start full liquid diet - Findings discussed with Dr. Rolm Bookbinder. -Recommend repeat colonoscopy in 1 year. -No further inpatient GI work-up planned.  GI will sign off.  Call us back if needed.  Otis Brace MD, [place] 07/19/2019, 2:47 PM  Contact #  [phone]

## 2019-07-20 ENCOUNTER — Encounter (HOSPITAL_COMMUNITY): Payer: Self-pay | Admitting: Gastroenterology

## 2019-07-20 LAB — CBC
HCT: 28.2 % — ABNORMAL LOW (ref 39.0–52.0)
Hemoglobin: 8.2 g/dL — ABNORMAL LOW (ref 13.0–17.0)
MCH: 19.2 pg — ABNORMAL LOW (ref 26.0–34.0)
MCHC: 29.1 g/dL — ABNORMAL LOW (ref 30.0–36.0)
MCV: 66 fL — ABNORMAL LOW (ref 80.0–100.0)
Platelets: 462 10*3/uL — ABNORMAL HIGH (ref 150–400)
RBC: 4.27 MIL/uL (ref 4.22–5.81)
RDW: 23.3 % — ABNORMAL HIGH (ref 11.5–15.5)
WBC: 6.3 10*3/uL (ref 4.0–10.5)
nRBC: 0 % (ref 0.0–0.2)

## 2019-07-20 LAB — PREALBUMIN: Prealbumin: 5.6 mg/dL — ABNORMAL LOW (ref 18–38)

## 2019-07-20 LAB — SURGICAL PATHOLOGY

## 2019-07-20 MED ORDER — ACETAMINOPHEN 325 MG PO TABS: 650.0000 mg | ORAL_TABLET | Freq: Four times a day (QID) | ORAL | Status: DC | PRN

## 2019-07-20 MED ORDER — ENSURE ENLIVE PO LIQD
237.0000 mL | Freq: Three times a day (TID) | ORAL | Status: DC
  Administered 2019-07-20: 237 mL via ORAL

## 2019-07-20 NOTE — Discharge Instructions (Addendum)
[facility]      [facility] °[phone] ° °OPEN ABDOMINAL SURGERY: POST OP INSTRUCTIONS ° °Always review your discharge instruction sheet given to you by the facility where your surgery was performed. ° °IF YOU HAVE DISABILITY OR FAMILY LEAVE FORMS, YOU MUST BRING THEM TO THE OFFICE FOR PROCESSING.  PLEASE DO NOT GIVE THEM TO YOUR DOCTOR. ° °1. A prescription for pain medication may be given to you upon discharge.  Take your pain medication as prescribed, if needed.  If narcotic pain medicine is not needed, then you may take acetaminophen (Tylenol) or ibuprofen (Advil) as needed. °2. Take your usually prescribed medications unless otherwise directed. °3. If you need a refill on your pain medication, please contact your pharmacy. They will contact our office to request authorization.  Prescriptions will not be filled after 5pm or on week-ends. °4. You should follow a light diet the first few days after arrival home, such as soup and crackers, pudding, etc.unless your doctor has advised otherwise. A high-fiber, low fat diet can be resumed as tolerated.   Be sure to include lots of fluids daily. Most patients will experience some swelling and bruising on the chest and neck area.  Ice packs will help.  Swelling and bruising can take several days to resolve °5. Most patients will experience some swelling and bruising in the area of the incision. Ice pack will help. Swelling and bruising can take several days to resolve..  °6. It is common to experience some constipation if taking pain medication after surgery.  Increasing fluid intake and taking a stool softener will usually help or prevent this problem from occurring.  A mild laxative (Milk of Magnesia or Miralax) should be taken according to package directions if there are no bowel movements after 48 hours. °7.  You may have steri-strips (small skin tapes) in place directly over the incision.  These strips should be left on the skin for 7-10 days.  If your  surgeon used skin glue on the incision, you may shower in 24 hours.  The glue will flake off over the next 2-3 weeks.  Any sutures or staples will be removed at the office during your follow-up visit. You may find that a light gauze bandage over your incision may keep your staples from being rubbed or pulled. You may shower and replace the bandage daily. °8. ACTIVITIES:  You may resume regular (light) daily activities beginning the next day--such as daily self-care, walking, climbing stairs--gradually increasing activities as tolerated.  You may have sexual intercourse when it is comfortable.  Refrain from any heavy lifting or straining until approved by your doctor. °a. You may drive when you no longer are taking prescription pain medication, you can comfortably wear a seatbelt, and you can safely maneuver your car and apply brakes °b. Return to Work: ___________________________________ °9. You should see your doctor in the office for a follow-up appointment approximately two weeks after your surgery.  Make sure that you call for this appointment within a day or two after you arrive home to insure a convenient appointment time. °OTHER INSTRUCTIONS:  °_____________________________________________________________ °_____________________________________________________________ ° °WHEN TO CALL YOUR DOCTOR: °1. Fever over 101.0 °2. Inability to urinate °3. Nausea and/or vomiting °4. Extreme swelling or bruising °5. Continued bleeding from incision. °6. Increased pain, redness, or drainage from the incision. °7. Difficulty swallowing or breathing °8. Muscle cramping or spasms. °9. Numbness or tingling in hands or feet or around lips. ° °The clinic staff is available to   answer your questions during regular business hours.  Please dont hesitate to call and ask to speak to one of the nurses if you have concerns.  For further questions, please visit [email or web address]     Stay on a liquid diet over the  weekend and prior to surgery.  Drink 3 Boost or Ensure daily to help improve your nutritional status. Take iron supplement prescribed twice daily.  Full Liquid Diet A full liquid diet refers to fluids and foods that are liquid or will become liquid at room temperature. This diet should only be used for a short period of time to help you recover from illness or surgery. Your health care provider or dietitian will help you determine when it is safe to eat regular foods. What are tips for following this plan?     Reading food labels  Check food labels of nutrition shakes for the amount of protein. Look for nutrition shakes that have at least 8-10 grams of protein in each serving.  Look for drinks, such as milks and juices, that are "fortified" or "enriched." This means that vitamins and minerals have been added. Shopping  Buy premade nutritional shakes to keep on hand.  To vary your choices, buy different flavors of milks and shakes. Meal planning  Choose flavors and foods that you enjoy.  To make sure you get enough energy from food (calories): ? Eat 3 full liquid meals each day. Have a liquid snack between each meal. ? Drink 6-8 ounces (177-237 ml) of a nutrition supplement shake with meals or as snacks. ? Add protein powder, powdered milk, milk, or yogurt to shakes to increase the amount of protein.  Drink at least one serving a day of citrus fruit juice or fruit juice that has vitamin C added. General guidelines  Before starting the full-liquid diet, check with your health care provider to know what foods you should avoid. These may include full-fat or high-fiber liquids.  You may have any liquid or food that becomes a liquid at room temperature. The food is considered a liquid if it can be poured off a spoon at room temperature.  Do not drink alcohol unless approved by your health care provider.  This diet gives you most of the nutrients that you need for energy, but you may  not get enough of certain vitamins, minerals, and fiber. Make sure to talk to your health care provider or dietitian about: ? How many calories you need to eat get day. ? How much fluid you should have each day. ? Taking a multivitamin or a nutritional supplement. What foods are allowed? The items listed may not be a complete list. Talk with your dietitian about what dietary choices are best for you. Grains Thin hot cereal, such as cream of wheat. Soft-cooked pasta or rice pured in soup. Vegetables Pulp-free tomato or vegetable juice. Vegetables pured in soup. Fruits Fruit juice without pulp. Strained fruit pures (seeds and skins removed). Meats and other protein foods Beef, chicken, and fish broths. Powdered protein supplements. Dairy Milk and milk-based beverages, including milk shakes and instant breakfast mixes. Smooth yogurt. Pured cottage cheese. Beverages Water. Coffee and tea (caffeinated or decaffeinated). Cocoa. Liquid nutritional supplements. Soft drinks. Nondairy milks, such as almond, coconut, rice, or soy milk. Fats and oils Melted margarine and butter. Cream. Canola, almond, avocado, corn, grapeseed, sunflower, and sesame oils. Gravy. Sweets and desserts Custard. Pudding. Flavored gelatin. Smooth ice cream (without nuts or candy pieces). Sherbet. Popsicles. [place] ice. Pudding pops. Seasoning  and other foods Salt and pepper. Spices. Cocoa powder. Vinegar. Ketchup. Yellow mustard. Smooth sauces, such as Hollandaise, cheese sauce, or white sauce. Soy sauce. Cream soups. Strained soups. Syrup. Honey. Jelly (without fruit pieces). What foods are not allowed? The items listed may not be a complete list. Talk with your dietitian about what dietary choices are best for you. Grains Whole grains. Pasta. Rice. Cold cereal. Bread. Crackers. Vegetables All whole fresh, frozen, or canned vegetables. Fruits All whole fresh, frozen, or canned fruits. Meats and other protein  foods All cuts of meat, poultry, and fish. Eggs. Tofu and soy protein. Nuts and nut butters. Lunch meat. Sausage. Dairy Hard cheese. Yogurt with fruit chunks. Fats and oils Coconut oil. Palm oil. Lard. Cold butter. Sweets and desserts Ice cream or other frozen desserts that have any solids in them or on top, such as nuts, chocolate chips, and pieces of cookies. Cakes. Cookies. Candy. Seasoning and other foods Stone-ground mustards. Soups with chunks or pieces. Summary  A full liquid diet refers to fluids and foods that are liquid or will become liquid at room temperature.  This diet should only be used for a short period of time to help you recover from illness or surgery. Ask your health care provider or dietitian when it is safe for you to eat regular foods.  To make sure you get enough calories and nutrients, eat 3 meals each day with snacks between. Drink premade nutrition supplement shakes or add protein powder to homemade shakes. Take a vitamin and mineral supplement as told by your health care provider. This information is not intended to replace advice given to you by your health care provider. Make sure you discuss any questions you have with your health care provider. Document Released: 10/11/2005 Document Revised: 01/07/2018 Document Reviewed: 11/24/2016 Elsevier Patient Education  2020 [employer].

## 2019-07-20 NOTE — Progress Notes (Signed)
[facility] Progress Note  1 Day Post-Op  Subjective: CC-  Feeling ok today. Mild abdominal discomfort with grits. Denies any pain at this time. No n/v. Tolerating full liquids. Hgb down 8.2 from 8.9.  Objective: Vital signs in last 24 hours: Temp:  [97.5 F (36.4 C)-98.5 F (36.9 C)] 97.5 F (36.4 C) (09/25 1054) Pulse Rate:  [51-74] 74 (09/25 1054) Resp:  [14-22] 16 (09/25 1054) BP: (108-133)/(55-85) 133/80 (09/25 1054) SpO2:  [98 %-100 %] 100 % (09/25 1054) Last BM Date: 07/19/19  Intake/Output from previous day: 09/24 0701 - 09/25 0700 In: 2462.4 [P.O.:180; I.V.:2132.4; IV Piggyback:150] Out: 0  Intake/Output this shift: No intake/output data recorded.  PE: Gen: Alert, NAD, pleasant HEENT: EOM's intact, pupils equal and round Card: RRR Pulm: CTAB, no W/R/R, effort normal Abd: Soft, ND, +BS, no HSM,nontender Psych: A&Ox3  Skin: warm and dry  Lab Results:  Recent Labs    07/19/19 0400 07/20/19 0229  WBC 7.4 6.3  HGB 8.9* 8.2*  HCT 30.5* 28.2*  PLT 501* 462*   BMET Recent Labs    07/18/19 0342 07/19/19 0400  NA 134* 138  K 4.1 3.9  CL 101 106  CO2 23 23  GLUCOSE 107* 96  BUN 8 6  CREATININE 0.91 0.86  CALCIUM 8.0* 8.3*   PT/INR No results for input(s): LABPROT, INR in the last 72 hours. CMP     Component Value Date/Time   NA 138 07/19/2019 0400   K 3.9 07/19/2019 0400   CL 106 07/19/2019 0400   CO2 23 07/19/2019 0400   GLUCOSE 96 07/19/2019 0400   BUN 6 07/19/2019 0400   CREATININE 0.86 07/19/2019 0400   CALCIUM 8.3 (L) 07/19/2019 0400   PROT 7.2 07/17/2019 1142   ALBUMIN 3.5 07/17/2019 1142   AST 15 07/17/2019 1142   ALT 13 07/17/2019 1142   ALKPHOS 76 07/17/2019 1142   BILITOT 0.7 07/17/2019 1142   GFRNONAA >60 07/19/2019 0400   GFRAA >60 07/19/2019 0400   Lipase     Component Value Date/Time   LIPASE 20 07/17/2019 1142       Studies/Results: Ct Chest W Contrast  Result Date: 07/20/2019 CLINICAL DATA:   Colon cancer. EXAM: CT CHEST WITH CONTRAST TECHNIQUE: Multidetector CT imaging of the chest was performed during intravenous contrast administration. CONTRAST:  58mL OMNIPAQUE IOHEXOL 300 MG/ML  SOLN COMPARISON:  None. FINDINGS: Cardiovascular: Heart size upper normal. No pericardial effusion. No thoracic aortic aneurysm. Mediastinum/Nodes: No mediastinal lymphadenopathy. There is no hilar lymphadenopathy. The esophagus has normal imaging features. 7 mm calcified nodule identified right thyroid lobe. There is no axillary lymphadenopathy. Lungs/Pleura: No suspicious pulmonary nodule or mass. No focal airspace consolidation. No pleural effusion. 3 mm perifissural nodule right lower lobe on 93/7 is likely subpleural lymph node. Subsegmental atelectasis noted in the dependent lower lobes bilaterally. Upper Abdomen: Small calcified gallstones evident. Small portion of the cranial most aspect of the right colon lesion identified. Musculoskeletal: No worrisome lytic or sclerotic osseous abnormality. Status post ORIF for left clavicle fracture. IMPRESSION: 1. No evidence for metastatic disease in the chest. 3 mm perifissural nodule in the right lung is likely a subpleural lymph node. Stability could be confirmed on follow-up imaging. 2. Status post ORIF for left clavicle fracture. Electronically Signed   By: Misty Stanley M.D.   On: 07/20/2019 08:08    Anti-infectives: Anti-infectives (From admission, onward)   Start     Dose/Rate Route Frequency Ordered Stop   07/18/19 0130  piperacillin-tazobactam (ZOSYN) IVPB 3.375 g     3.375 g 12.5 mL/hr over 240 Minutes Intravenous Every 8 hours 07/17/19 1853     07/17/19 1900  piperacillin-tazobactam (ZOSYN) IVPB 3.375 g     3.375 g 100 mL/hr over 30 Minutes Intravenous  Once 07/17/19 1850 07/17/19 2327       Assessment/Plan Acute blood loss anemia - Hgb 8.2 from 8.9. s/p 2 units PRBCs 9/23. Repeat CBC in AM  Right colon mass - not sure of etiology but this  appears to be neoplasm - CEA 1.7 - s/p Colonoscopy 9/24 with Dr. Alessandra Bevels, preliminary pathology of polyps other than right colon mass negative for malignancy - Patient will stay this weekend to monitor H/H, may need another blood transfusion if hemoglobin continues to drop. Continue full liquids, Ensure supplements. Likely plan on OR Monday for right colectomy.   ID -zosyn 9/22>>9/25 FEN -IVF, FLD, Ensure VTE -SCDs, lovenox Foley -none Follow up -TBD   LOS: 3 days    Wellington Hampshire , [facility] 07/20/2019, 12:23 PM Pager: [phone] Mon-Thurs 7:00 am-4:30 pm Fri 7:00 am -11:30 AM Sat-Sun 7:00 am-11:30 am

## 2019-07-20 NOTE — Discharge Summary (Deleted)
[facility] Discharge Summary   Patient ID: William Livingston MRN: [id number] DOB/AGE: May 14, 1981 38 y.o.  Admit date: 07/17/2019 Discharge date: 07/20/2019  Admitting Diagnosis: Right colon mass  Discharge Diagnosis Patient Active Problem List   Diagnosis Date Noted  . Mass of colon 07/17/2019    Consultants None  Imaging: Ct Chest W Contrast  Result Date: 07/20/2019 CLINICAL DATA:  Colon cancer. EXAM: CT CHEST WITH CONTRAST TECHNIQUE: Multidetector CT imaging of the chest was performed during intravenous contrast administration. CONTRAST:  63mL OMNIPAQUE IOHEXOL 300 MG/ML  SOLN COMPARISON:  None. FINDINGS: Cardiovascular: Heart size upper normal. No pericardial effusion. No thoracic aortic aneurysm. Mediastinum/Nodes: No mediastinal lymphadenopathy. There is no hilar lymphadenopathy. The esophagus has normal imaging features. 7 mm calcified nodule identified right thyroid lobe. There is no axillary lymphadenopathy. Lungs/Pleura: No suspicious pulmonary nodule or mass. No focal airspace consolidation. No pleural effusion. 3 mm perifissural nodule right lower lobe on 93/7 is likely subpleural lymph node. Subsegmental atelectasis noted in the dependent lower lobes bilaterally. Upper Abdomen: Small calcified gallstones evident. Small portion of the cranial most aspect of the right colon lesion identified. Musculoskeletal: No worrisome lytic or sclerotic osseous abnormality. Status post ORIF for left clavicle fracture. IMPRESSION: 1. No evidence for metastatic disease in the chest. 3 mm perifissural nodule in the right lung is likely a subpleural lymph node. Stability could be confirmed on follow-up imaging. 2. Status post ORIF for left clavicle fracture. Electronically Signed   By: Misty Stanley M.D.   On: 07/20/2019 08:08    Procedures Dr. Alessandra Bevels (07/19/19) - Colonoscopy  Hospital Course:  William Livingston is a 38yo male with no PMH who presented to [facility] 9/22 after seeing GI  and being found to have hemoglobin 7.6. he reports a couple years of intermittent abdominal pain that has progressively gotten more constant over past six months. Workup included CT scan which showed large mass in right colon. Patient was admitted to the surgical service. He was given 2 units PRBCs 9/23 for acute blood loss anemia. He under went colonoscopy 9/24: sigmoid colon polyp removed with biopsy forcep, ascending colon mass biopsies, spot injected, rectum polyp removed cold snare. Tolerated procedure well. CT chest showed no evidence of metastatic disease. On 9/25 the patient was hemodynamically stable. He was discharged in good condition with plans for surgery next week. He knows to call with questions or concerns.     Physical Exam: Gen: Alert, NAD, pleasant HEENT: EOM's intact, pupils equal and round Card: RRR Pulm: CTAB, no W/R/R, effort normal Abd: Soft, ND, +BS, no HSM,nontender Psych: A&Ox3  Skin: warm and dry   Allergies as of 07/20/2019      Reactions   Amoxicillin       Medication List    TAKE these medications   acetaminophen 325 MG tablet Commonly known as: TYLENOL Take 2 tablets (650 mg total) by mouth every 6 (six) hours as needed for mild pain (or temp > 100).   magnesium 30 MG tablet Take 60 mg by mouth as needed (Hard workout).   Melatonin 10 MG Tabs Take 10 mg by mouth at bedtime as needed.        Follow-up [facility], [place]. Call.   Specialty: General Surgery Why: Our office will contact you with surgery plans for next week. Call with any concerns. Contact information: [address] [place] [phone]  Signed: Wellington Hampshire, [facility] 07/20/2019, 8:37 AM Pager: [phone] Mon-Thurs 7:00 am-4:30 pm Fri 7:00 am -11:30 AM Sat-Sun 7:00 am-11:30 am

## 2019-07-21 LAB — CBC
HCT: 28.4 % — ABNORMAL LOW (ref 39.0–52.0)
Hemoglobin: 8.5 g/dL — ABNORMAL LOW (ref 13.0–17.0)
MCH: 19.5 pg — ABNORMAL LOW (ref 26.0–34.0)
MCHC: 29.9 g/dL — ABNORMAL LOW (ref 30.0–36.0)
MCV: 65.3 fL — ABNORMAL LOW (ref 80.0–100.0)
Platelets: 473 10*3/uL — ABNORMAL HIGH (ref 150–400)
RBC: 4.35 MIL/uL (ref 4.22–5.81)
RDW: 23.9 % — ABNORMAL HIGH (ref 11.5–15.5)
WBC: 5.4 10*3/uL (ref 4.0–10.5)
nRBC: 0 % (ref 0.0–0.2)

## 2019-07-21 MED ORDER — ENSURE PRE-SURGERY PO LIQD
296.0000 mL | Freq: Three times a day (TID) | ORAL | Status: DC
  Administered 2019-07-21: 296 mL via ORAL
  Administered 2019-07-21: 237 mL via ORAL
  Administered 2019-07-21 – 2019-07-23 (×5): 296 mL via ORAL
  Filled 2019-07-21 (×8): qty 296

## 2019-07-21 NOTE — Progress Notes (Signed)
[facility] Progress Note  2 Days Post-Op  Subjective: CC-  He reports some abdominal pain last night. Feeling a little better this morning. Thinks that he over did PO intake yesterday. Denies n/v. Passing flatus. No BM yesterday. Hgb up 8.5 from 8.2  Objective: Vital signs in last 24 hours: Temp:  [97.5 F (36.4 C)-98.3 F (36.8 C)] 97.9 F (36.6 C) (09/26 0604) Pulse Rate:  [45-89] 45 (09/26 0604) Resp:  [16-18] 18 (09/26 0604) BP: (106-133)/(59-80) 106/74 (09/26 0604) SpO2:  [99 %-100 %] 99 % (09/26 0604) Last BM Date: 07/19/19  Intake/Output from previous day: 09/25 0701 - 09/26 0700 In: 2696.3 [I.V.:2651.6; IV Piggyback:44.7] Out: 200 [Urine:200] Intake/Output this shift: No intake/output data recorded.  PE: Gen: Alert, NAD, pleasant HEENT: EOM's intact, pupils equal and round Card: RRR Pulm: CTAB, no W/R/R, effort normal Abd: Soft, ND, +BS, no HSM,nontender Psych: A&Ox3  Skin: warm and dry   Lab Results:  Recent Labs    07/20/19 0229 07/21/19 0529  WBC 6.3 5.4  HGB 8.2* 8.5*  HCT 28.2* 28.4*  PLT 462* 473*   BMET Recent Labs    07/19/19 0400  NA 138  K 3.9  CL 106  CO2 23  GLUCOSE 96  BUN 6  CREATININE 0.86  CALCIUM 8.3*   PT/INR No results for input(s): LABPROT, INR in the last 72 hours. CMP     Component Value Date/Time   NA 138 07/19/2019 0400   K 3.9 07/19/2019 0400   CL 106 07/19/2019 0400   CO2 23 07/19/2019 0400   GLUCOSE 96 07/19/2019 0400   BUN 6 07/19/2019 0400   CREATININE 0.86 07/19/2019 0400   CALCIUM 8.3 (L) 07/19/2019 0400   PROT 7.2 07/17/2019 1142   ALBUMIN 3.5 07/17/2019 1142   AST 15 07/17/2019 1142   ALT 13 07/17/2019 1142   ALKPHOS 76 07/17/2019 1142   BILITOT 0.7 07/17/2019 1142   GFRNONAA >60 07/19/2019 0400   GFRAA >60 07/19/2019 0400   Lipase     Component Value Date/Time   LIPASE 20 07/17/2019 1142       Studies/Results: Ct Chest W Contrast  Result Date: 07/20/2019 CLINICAL  DATA:  Colon cancer. EXAM: CT CHEST WITH CONTRAST TECHNIQUE: Multidetector CT imaging of the chest was performed during intravenous contrast administration. CONTRAST:  12mL OMNIPAQUE IOHEXOL 300 MG/ML  SOLN COMPARISON:  None. FINDINGS: Cardiovascular: Heart size upper normal. No pericardial effusion. No thoracic aortic aneurysm. Mediastinum/Nodes: No mediastinal lymphadenopathy. There is no hilar lymphadenopathy. The esophagus has normal imaging features. 7 mm calcified nodule identified right thyroid lobe. There is no axillary lymphadenopathy. Lungs/Pleura: No suspicious pulmonary nodule or mass. No focal airspace consolidation. No pleural effusion. 3 mm perifissural nodule right lower lobe on 93/7 is likely subpleural lymph node. Subsegmental atelectasis noted in the dependent lower lobes bilaterally. Upper Abdomen: Small calcified gallstones evident. Small portion of the cranial most aspect of the right colon lesion identified. Musculoskeletal: No worrisome lytic or sclerotic osseous abnormality. Status post ORIF for left clavicle fracture. IMPRESSION: 1. No evidence for metastatic disease in the chest. 3 mm perifissural nodule in the right lung is likely a subpleural lymph node. Stability could be confirmed on follow-up imaging. 2. Status post ORIF for left clavicle fracture. Electronically Signed   By: Misty Stanley M.D.   On: 07/20/2019 08:08    Anti-infectives: Anti-infectives (From admission, onward)   Start     Dose/Rate Route Frequency Ordered Stop   07/18/19 0130  piperacillin-tazobactam (  ZOSYN) IVPB 3.375 g  Status:  Discontinued     3.375 g 12.5 mL/hr over 240 Minutes Intravenous Every 8 hours 07/17/19 1853 07/20/19 1225   07/17/19 1900  piperacillin-tazobactam (ZOSYN) IVPB 3.375 g     3.375 g 100 mL/hr over 30 Minutes Intravenous  Once 07/17/19 1850 07/17/19 2327       Assessment/Plan Acute blood loss anemia -Hgb 8.5 from 8.2. s/p 2 units PRBCs 9/23. Repeat CBC in AM  Right colon  mass - CEA 1.7 -s/p Colonoscopy 9/24 with Dr. Alessandra Bevels, pathology of polyps negative, right colon mass adenocarcinoma  ID -zosyn 9/22>>9/25 FEN -IVF, FLD, Ensure VTE -SCDs, lovenox Foley -none Follow up -TBD  Plan: Change ensure to pre-surgery Ensure with more protein. Continue full liquids today. Recheck H/H in AM. May need some sort of a colon prep tomorrow in preparation for OR Monday.   LOS: 4 days    Wellington Hampshire , [facility] 07/21/2019, 10:16 AM Pager: [phone] Mon-Thurs 7:00 am-4:30 pm Fri 7:00 am -11:30 AM Sat-Sun 7:00 am-11:30 am

## 2019-07-22 LAB — CBC
HCT: 30.9 % — ABNORMAL LOW (ref 39.0–52.0)
Hemoglobin: 9.3 g/dL — ABNORMAL LOW (ref 13.0–17.0)
MCH: 19.6 pg — ABNORMAL LOW (ref 26.0–34.0)
MCHC: 30.1 g/dL (ref 30.0–36.0)
MCV: 65.2 fL — ABNORMAL LOW (ref 80.0–100.0)
Platelets: 488 10*3/uL — ABNORMAL HIGH (ref 150–400)
RBC: 4.74 MIL/uL (ref 4.22–5.81)
RDW: 24.2 % — ABNORMAL HIGH (ref 11.5–15.5)
WBC: 6.2 10*3/uL (ref 4.0–10.5)
nRBC: 0 % (ref 0.0–0.2)

## 2019-07-22 LAB — CULTURE, BLOOD (ROUTINE X 2)
Culture: NO GROWTH
Culture: NO GROWTH
Special Requests: ADEQUATE
Special Requests: ADEQUATE

## 2019-07-22 LAB — SURGICAL PCR SCREEN
MRSA, PCR: NEGATIVE
Staphylococcus aureus: POSITIVE — AB

## 2019-07-22 MED ORDER — CHLORHEXIDINE GLUCONATE CLOTH 2 % EX PADS
6.0000 | MEDICATED_PAD | Freq: Once | CUTANEOUS | Status: AC
  Administered 2019-07-22: 21:00:00 6 via TOPICAL

## 2019-07-22 MED ORDER — GENTAMICIN SULFATE 40 MG/ML IJ SOLN
5.0000 mg/kg | INTRAVENOUS | Status: AC
  Administered 2019-07-23: 410 mg via INTRAVENOUS
  Filled 2019-07-22: qty 10.25

## 2019-07-22 MED ORDER — CELECOXIB 200 MG PO CAPS: 200.0000 mg | ORAL_CAPSULE | ORAL | Status: AC

## 2019-07-22 MED ORDER — CHLORHEXIDINE GLUCONATE CLOTH 2 % EX PADS
6.0000 | MEDICATED_PAD | Freq: Once | CUTANEOUS | Status: AC
  Administered 2019-07-23: 08:00:00 6 via TOPICAL

## 2019-07-22 MED ORDER — POLYETHYLENE GLYCOL 3350 17 G PO PACK
17.0000 g | PACK | Freq: Two times a day (BID) | ORAL | Status: AC
  Administered 2019-07-22 (×2): 17 g via ORAL
  Filled 2019-07-22 (×2): qty 1

## 2019-07-22 MED ORDER — ACETAMINOPHEN 500 MG PO TABS: 1000.0000 mg | ORAL_TABLET | ORAL | Status: AC

## 2019-07-22 MED ORDER — CLINDAMYCIN PHOSPHATE 900 MG/50ML IV SOLN
900.0000 mg | INTRAVENOUS | Status: DC
  Filled 2019-07-22: qty 50

## 2019-07-22 MED ORDER — GABAPENTIN 300 MG PO CAPS: 300.0000 mg | ORAL_CAPSULE | ORAL | Status: AC

## 2019-07-22 NOTE — Progress Notes (Signed)
[facility] Progress Note  3 Days Post-Op  Subjective: CC-  Continues to have some mild intermittent upper abdominal pain. Tolerating full liquids. Denies any n/v. Last BM 2 days ago. Ambulated about 30 laps yesterday. Hgb up to 9.3  Objective: Vital signs in last 24 hours: Temp:  [98 F (36.7 C)-98.4 F (36.9 C)] 98 F (36.7 C) (09/27 0840) Pulse Rate:  [54-83] 74 (09/27 0840) Resp:  [17-18] 17 (09/27 0840) BP: (112-131)/(71-82) 121/75 (09/27 0840) SpO2:  [97 %-98 %] 98 % (09/27 0840) Last BM Date: 07/19/19  Intake/Output from previous day: 09/26 0701 - 09/27 0700 In: 580 [P.O.:360; I.V.:220] Out: -  Intake/Output this shift: Total I/O In: 120 [P.O.:120] Out: -   PE: Gen: Alert, NAD, pleasant HEENT: EOM's intact, pupils equal and round Card: RRR Pulm: CTAB, no W/R/R, effort normal Abd: Soft, ND, +BS, no HSM,nontender Psych: A&Ox3  Skin: warm and dry   Lab Results:  Recent Labs    07/21/19 0529 07/22/19 0254  WBC 5.4 6.2  HGB 8.5* 9.3*  HCT 28.4* 30.9*  PLT 473* 488*   BMET No results for input(s): NA, K, CL, CO2, GLUCOSE, BUN, CREATININE, CALCIUM in the last 72 hours. PT/INR No results for input(s): LABPROT, INR in the last 72 hours. CMP     Component Value Date/Time   NA 138 07/19/2019 0400   K 3.9 07/19/2019 0400   CL 106 07/19/2019 0400   CO2 23 07/19/2019 0400   GLUCOSE 96 07/19/2019 0400   BUN 6 07/19/2019 0400   CREATININE 0.86 07/19/2019 0400   CALCIUM 8.3 (L) 07/19/2019 0400   PROT 7.2 07/17/2019 1142   ALBUMIN 3.5 07/17/2019 1142   AST 15 07/17/2019 1142   ALT 13 07/17/2019 1142   ALKPHOS 76 07/17/2019 1142   BILITOT 0.7 07/17/2019 1142   GFRNONAA >60 07/19/2019 0400   GFRAA >60 07/19/2019 0400   Lipase     Component Value Date/Time   LIPASE 20 07/17/2019 1142       Studies/Results: No results found.  Anti-infectives: Anti-infectives (From admission, onward)   Start     Dose/Rate Route Frequency  Ordered Stop   07/22/19 1032  clindamycin (CLEOCIN) IVPB 900 mg     900 mg 100 mL/hr over 30 Minutes Intravenous 60 min pre-op 07/22/19 1033     07/22/19 1032  gentamicin (GARAMYCIN) 410 mg in dextrose 5 % 100 mL IVPB     5 mg/kg  82.1 kg 110.3 mL/hr over 60 Minutes Intravenous 60 min pre-op 07/22/19 1033     07/18/19 0130  piperacillin-tazobactam (ZOSYN) IVPB 3.375 g  Status:  Discontinued     3.375 g 12.5 mL/hr over 240 Minutes Intravenous Every 8 hours 07/17/19 1853 07/20/19 1225   07/17/19 1900  piperacillin-tazobactam (ZOSYN) IVPB 3.375 g     3.375 g 100 mL/hr over 30 Minutes Intravenous  Once 07/17/19 1850 07/17/19 2327       Assessment/Plan Acute blood loss anemia -Hgb9.3 from 8.5. s/p2 units PRBCs 9/23. Repeat CBC in AM  Right colon mass - CEA1.7 -s/pColonoscopy9/24 withDr. Alessandra Bevels, pathology of polyps negative, right colon mass adenocarcinoma  ID -zosyn 9/22>>9/25 FEN -IVF,CLD, Ensure VTE -SCDs, lovenox Foley -none Follow up -TBD  Plan: Clear liquids today and pre-surgery Ensure drinks. Will give 2 doses of miralax. Plan for right colectomy tomorrow.    LOS: 5 days    [facility] 07/22/2019, 10:50 AM Pager: [phone] Mon-Thurs 7:00 am-4:30 pm Fri 7:00  am -11:30 AM Sat-Sun 7:00 am-11:30 am

## 2019-07-23 ENCOUNTER — Encounter (HOSPITAL_COMMUNITY): Payer: Self-pay | Admitting: Certified Registered"

## 2019-07-23 ENCOUNTER — Encounter (HOSPITAL_COMMUNITY): Admission: EM | Disposition: A | Discharge status: Home / Self Care | Payer: Self-pay | Source: Home / Self Care

## 2019-07-23 ENCOUNTER — Inpatient Hospital Stay (HOSPITAL_COMMUNITY): Payer: Managed Care, Other (non HMO) | Admitting: Certified Registered"

## 2019-07-23 HISTORY — PX: CHOLECYSTECTOMY: SHX55

## 2019-07-23 HISTORY — PX: COLON RESECTION: SHX5231

## 2019-07-23 LAB — BASIC METABOLIC PANEL
Anion gap: 10 (ref 5–15)
BUN: 7 mg/dL (ref 6–20)
CO2: 26 mmol/L (ref 22–32)
Calcium: 8.5 mg/dL — ABNORMAL LOW (ref 8.9–10.3)
Chloride: 100 mmol/L (ref 98–111)
Creatinine, Ser: 0.8 mg/dL (ref 0.61–1.24)
GFR calc Af Amer: >60 mL/min (ref >60)
GFR calc non Af Amer: >60 mL/min (ref >60)
Glucose, Bld: 104 mg/dL — ABNORMAL HIGH (ref 70–99)
Potassium: 4.1 mmol/L (ref 3.5–5.1)
Sodium: 136 mmol/L (ref 135–145)

## 2019-07-23 LAB — CBC
HCT: 33.4 % — ABNORMAL LOW (ref 39.0–52.0)
Hemoglobin: 9.6 g/dL — ABNORMAL LOW (ref 13.0–17.0)
MCH: 19 pg — ABNORMAL LOW (ref 26.0–34.0)
MCHC: 28.7 g/dL — ABNORMAL LOW (ref 30.0–36.0)
MCV: 66.3 fL — ABNORMAL LOW (ref 80.0–100.0)
Platelets: 504 10*3/uL — ABNORMAL HIGH (ref 150–400)
RBC: 5.04 MIL/uL (ref 4.22–5.81)
RDW: 24.4 % — ABNORMAL HIGH (ref 11.5–15.5)
WBC: 5.7 10*3/uL (ref 4.0–10.5)
nRBC: 0 % (ref 0.0–0.2)

## 2019-07-23 LAB — PREALBUMIN: Prealbumin: 11.2 mg/dL — ABNORMAL LOW (ref 18–38)

## 2019-07-23 SURGERY — COLON RESECTION LAPAROSCOPIC
Anesthesia: General

## 2019-07-23 MED ORDER — NALOXONE HCL 0.4 MG/ML IJ SOLN: 0.4000 mg | INTRAMUSCULAR | Status: DC | PRN

## 2019-07-23 MED ORDER — GENTAMICIN SULFATE 40 MG/ML IJ SOLN
5.0000 mg/kg | INTRAVENOUS | Status: DC
  Filled 2019-07-23: qty 10.25

## 2019-07-23 MED ORDER — ROCURONIUM BROMIDE 10 MG/ML (PF) SYRINGE
PREFILLED_SYRINGE | INTRAVENOUS | Status: AC
  Filled 2019-07-23: qty 10

## 2019-07-23 MED ORDER — PROMETHAZINE HCL 25 MG/ML IJ SOLN: 6.2500 mg | INTRAMUSCULAR | Status: DC | PRN

## 2019-07-23 MED ORDER — MIDAZOLAM HCL 5 MG/5ML IJ SOLN
INTRAMUSCULAR | Status: DC | PRN
  Administered 2019-07-23: 2 mg via INTRAVENOUS

## 2019-07-23 MED ORDER — ONDANSETRON HCL 4 MG/2ML IJ SOLN
INTRAMUSCULAR | Status: DC | PRN
  Administered 2019-07-23: 4 mg via INTRAVENOUS

## 2019-07-23 MED ORDER — SUCCINYLCHOLINE CHLORIDE 200 MG/10ML IV SOSY
PREFILLED_SYRINGE | INTRAVENOUS | Status: AC
  Filled 2019-07-23: qty 10

## 2019-07-23 MED ORDER — LIDOCAINE 2% (20 MG/ML) 5 ML SYRINGE
INTRAMUSCULAR | Status: DC | PRN
  Administered 2019-07-23: 60 mg via INTRAVENOUS

## 2019-07-23 MED ORDER — SUGAMMADEX SODIUM 200 MG/2ML IV SOLN
INTRAVENOUS | Status: DC | PRN
  Administered 2019-07-23: 160 mg via INTRAVENOUS

## 2019-07-23 MED ORDER — DEXAMETHASONE SODIUM PHOSPHATE 10 MG/ML IJ SOLN
INTRAMUSCULAR | Status: AC
  Filled 2019-07-23: qty 1

## 2019-07-23 MED ORDER — BUPIVACAINE-EPINEPHRINE (PF) 0.25% -1:200000 IJ SOLN
INTRAMUSCULAR | Status: DC | PRN
  Administered 2019-07-23: 18 mL via PERINEURAL

## 2019-07-23 MED ORDER — MEPERIDINE HCL 25 MG/ML IJ SOLN
6.2500 mg | INTRAMUSCULAR | Status: DC | PRN
  Administered 2019-07-23 (×2): 12.5 mg via INTRAVENOUS

## 2019-07-23 MED ORDER — ONDANSETRON HCL 4 MG/2ML IJ SOLN
INTRAMUSCULAR | Status: AC
  Filled 2019-07-23: qty 2

## 2019-07-23 MED ORDER — LACTATED RINGERS IV SOLN
INTRAVENOUS | Status: DC
  Administered 2019-07-23 (×2): via INTRAVENOUS

## 2019-07-23 MED ORDER — OXYCODONE HCL 5 MG PO TABS
ORAL_TABLET | ORAL | Status: AC
  Filled 2019-07-23: qty 1

## 2019-07-23 MED ORDER — HYDROMORPHONE HCL 1 MG/ML IJ SOLN
0.2500 mg | INTRAMUSCULAR | Status: DC | PRN
  Administered 2019-07-23 (×4): 0.5 mg via INTRAVENOUS

## 2019-07-23 MED ORDER — PROPOFOL 10 MG/ML IV BOLUS
INTRAVENOUS | Status: DC | PRN
  Administered 2019-07-23: 30 mg via INTRAVENOUS
  Administered 2019-07-23: 150 mg via INTRAVENOUS

## 2019-07-23 MED ORDER — FENTANYL CITRATE (PF) 250 MCG/5ML IJ SOLN
INTRAMUSCULAR | Status: AC
  Filled 2019-07-23: qty 5

## 2019-07-23 MED ORDER — 0.9 % SODIUM CHLORIDE (POUR BTL) OPTIME
TOPICAL | Status: DC | PRN
  Administered 2019-07-23 (×2): 2000 mL

## 2019-07-23 MED ORDER — PROPOFOL 10 MG/ML IV BOLUS
INTRAVENOUS | Status: AC
  Filled 2019-07-23: qty 40

## 2019-07-23 MED ORDER — BUPIVACAINE HCL (PF) 0.25 % IJ SOLN
INTRAMUSCULAR | Status: AC
  Filled 2019-07-23: qty 30

## 2019-07-23 MED ORDER — HYDROMORPHONE HCL 1 MG/ML IJ SOLN
INTRAMUSCULAR | Status: AC
  Filled 2019-07-23: qty 1

## 2019-07-23 MED ORDER — ONDANSETRON HCL 4 MG/2ML IJ SOLN: 4.0000 mg | Freq: Four times a day (QID) | INTRAMUSCULAR | Status: DC | PRN

## 2019-07-23 MED ORDER — BUPIVACAINE-EPINEPHRINE (PF) 0.5% -1:200000 IJ SOLN
INTRAMUSCULAR | Status: AC
  Filled 2019-07-23: qty 30

## 2019-07-23 MED ORDER — SUCCINYLCHOLINE CHLORIDE 200 MG/10ML IV SOSY
PREFILLED_SYRINGE | INTRAVENOUS | Status: DC | PRN
  Administered 2019-07-23: 120 mg via INTRAVENOUS

## 2019-07-23 MED ORDER — LIDOCAINE 2% (20 MG/ML) 5 ML SYRINGE
INTRAMUSCULAR | Status: AC
  Filled 2019-07-23: qty 5

## 2019-07-23 MED ORDER — CLINDAMYCIN PHOSPHATE 900 MG/50ML IV SOLN
900.0000 mg | INTRAVENOUS | Status: AC
  Administered 2019-07-23: 900 mg via INTRAVENOUS
  Filled 2019-07-23: qty 50

## 2019-07-23 MED ORDER — SODIUM CHLORIDE 0.9% FLUSH: 9.0000 mL | INTRAVENOUS | Status: DC | PRN

## 2019-07-23 MED ORDER — ROCURONIUM BROMIDE 10 MG/ML (PF) SYRINGE
PREFILLED_SYRINGE | INTRAVENOUS | Status: AC
  Filled 2019-07-23: qty 20

## 2019-07-23 MED ORDER — FENTANYL CITRATE (PF) 100 MCG/2ML IJ SOLN
INTRAMUSCULAR | Status: DC | PRN
  Administered 2019-07-23: 75 ug via INTRAVENOUS
  Administered 2019-07-23 (×3): 50 ug via INTRAVENOUS
  Administered 2019-07-23: 100 ug via INTRAVENOUS
  Administered 2019-07-23 (×2): 50 ug via INTRAVENOUS

## 2019-07-23 MED ORDER — DIPHENHYDRAMINE HCL 12.5 MG/5ML PO ELIX: 12.5000 mg | ORAL_SOLUTION | Freq: Four times a day (QID) | ORAL | Status: DC | PRN

## 2019-07-23 MED ORDER — MIDAZOLAM HCL 2 MG/2ML IJ SOLN
INTRAMUSCULAR | Status: AC
  Filled 2019-07-23: qty 2

## 2019-07-23 MED ORDER — CELECOXIB 200 MG PO CAPS
200.0000 mg | ORAL_CAPSULE | ORAL | Status: AC
  Administered 2019-07-23: 08:00:00 200 mg via ORAL
  Filled 2019-07-23: qty 1

## 2019-07-23 MED ORDER — OXYCODONE HCL 5 MG PO TABS
5.0000 mg | ORAL_TABLET | Freq: Once | ORAL | Status: AC | PRN
  Administered 2019-07-23: 5 mg via ORAL

## 2019-07-23 MED ORDER — HYDROMORPHONE 1 MG/ML IV SOLN
INTRAVENOUS | Status: DC
  Administered 2019-07-23: 30 mg via INTRAVENOUS
  Administered 2019-07-23: 1.8 mg via INTRAVENOUS
  Administered 2019-07-24: 0.6 mg via INTRAVENOUS
  Administered 2019-07-24: 1.5 mg via INTRAVENOUS
  Administered 2019-07-24: 0.9 mg via INTRAVENOUS
  Administered 2019-07-24: 1.8 mg via INTRAVENOUS
  Administered 2019-07-24: 0.3 mg via INTRAVENOUS
  Administered 2019-07-24: 2.7 mg via INTRAVENOUS
  Administered 2019-07-24: 2.4 mg via INTRAVENOUS
  Administered 2019-07-25: 1.2 mg via INTRAVENOUS
  Administered 2019-07-25: 3.6 mg via INTRAVENOUS
  Administered 2019-07-25: 1.5 mg via INTRAVENOUS
  Administered 2019-07-25: 30 mg via INTRAVENOUS
  Administered 2019-07-25: 3.3 mg via INTRAVENOUS
  Administered 2019-07-25: 1.6 mg via INTRAVENOUS
  Administered 2019-07-26: 0.9 mg via INTRAVENOUS
  Administered 2019-07-26: 0.6 mg via INTRAVENOUS
  Filled 2019-07-23 (×3): qty 30

## 2019-07-23 MED ORDER — MEPERIDINE HCL 25 MG/ML IJ SOLN
INTRAMUSCULAR | Status: AC
  Filled 2019-07-23: qty 1

## 2019-07-23 MED ORDER — ROCURONIUM BROMIDE 10 MG/ML (PF) SYRINGE
PREFILLED_SYRINGE | INTRAVENOUS | Status: DC | PRN
  Administered 2019-07-23: 60 mg via INTRAVENOUS
  Administered 2019-07-23: 30 mg via INTRAVENOUS
  Administered 2019-07-23 (×3): 10 mg via INTRAVENOUS

## 2019-07-23 MED ORDER — GABAPENTIN 300 MG PO CAPS
300.0000 mg | ORAL_CAPSULE | ORAL | Status: AC
  Administered 2019-07-23: 300 mg via ORAL
  Filled 2019-07-23: qty 1

## 2019-07-23 MED ORDER — SODIUM CHLORIDE 0.9 % IV SOLN
INTRAVENOUS | Status: DC
  Administered 2019-07-23 – 2019-07-25 (×6): via INTRAVENOUS

## 2019-07-23 MED ORDER — DIPHENHYDRAMINE HCL 50 MG/ML IJ SOLN: 12.5000 mg | Freq: Four times a day (QID) | INTRAMUSCULAR | Status: DC | PRN

## 2019-07-23 MED ORDER — ACETAMINOPHEN 500 MG PO TABS
1000.0000 mg | ORAL_TABLET | ORAL | Status: AC
  Administered 2019-07-23: 1000 mg via ORAL
  Filled 2019-07-23: qty 2

## 2019-07-23 MED ORDER — OXYCODONE HCL 5 MG/5ML PO SOLN: 5.0000 mg | Freq: Once | ORAL | Status: AC | PRN

## 2019-07-23 MED ORDER — DEXAMETHASONE SODIUM PHOSPHATE 10 MG/ML IJ SOLN
INTRAMUSCULAR | Status: DC | PRN
  Administered 2019-07-23: 10 mg via INTRAVENOUS

## 2019-07-23 SURGICAL SUPPLY — 77 items
APPLIER CLIP 5 13 M/L LIGAMAX5 (MISCELLANEOUS)
APPLIER CLIP ROT 10 11.4 M/L (STAPLE)
APR CLP MED LRG 11.4X10 (STAPLE)
APR CLP MED LRG 5 ANG JAW (MISCELLANEOUS)
BLADE CLIPPER SURG (BLADE) ×2 IMPLANT
CANISTER SUCT 3000ML PPV (MISCELLANEOUS) ×1 IMPLANT
CELLS DAT CNTRL 66122 CELL SVR (MISCELLANEOUS) IMPLANT
CLIP APPLIE 5 13 M/L LIGAMAX5 (MISCELLANEOUS) IMPLANT
CLIP APPLIE ROT 10 11.4 M/L (STAPLE) IMPLANT
CLIP VESOCCLUDE LG 6/CT (CLIP) ×2 IMPLANT
CONT SPEC 4OZ CLIKSEAL STRL BL (MISCELLANEOUS) ×2 IMPLANT
COVER SURGICAL LIGHT HANDLE (MISCELLANEOUS) ×6 IMPLANT
COVER WAND RF STERILE (DRAPES) ×1 IMPLANT
DECANTER SPIKE VIAL GLASS SM (MISCELLANEOUS) ×2 IMPLANT
DRAPE WARM FLUID 44X44 (DRAPES) ×3 IMPLANT
DRSG OPSITE POSTOP 4X10 (GAUZE/BANDAGES/DRESSINGS) IMPLANT
DRSG OPSITE POSTOP 4X8 (GAUZE/BANDAGES/DRESSINGS) ×6 IMPLANT
DRSG TEGADERM 2-3/8X2-3/4 SM (GAUZE/BANDAGES/DRESSINGS) ×2 IMPLANT
ELECT REM PT RETURN 9FT ADLT (ELECTROSURGICAL) ×3
ELECTRODE REM PT RTRN 9FT ADLT (ELECTROSURGICAL) ×1 IMPLANT
GAUZE SPONGE 2X2 8PLY STRL LF (GAUZE/BANDAGES/DRESSINGS) IMPLANT
GEL ULTRASOUND 20GR AQUASONIC (MISCELLANEOUS) IMPLANT
GLOVE BIO SURGEON STRL SZ8 (GLOVE) ×6 IMPLANT
GLOVE BIOGEL PI IND STRL 6 (GLOVE) IMPLANT
GLOVE BIOGEL PI IND STRL 6.5 (GLOVE) IMPLANT
GLOVE BIOGEL PI IND STRL 8 (GLOVE) ×2 IMPLANT
GLOVE BIOGEL PI INDICATOR 6 (GLOVE) ×4
GLOVE BIOGEL PI INDICATOR 6.5 (GLOVE) ×2
GLOVE BIOGEL PI INDICATOR 8 (GLOVE) ×4
GLOVE SURG SS PI 6.0 STRL IVOR (GLOVE) ×2 IMPLANT
GLOVE SURG SS PI 6.5 STRL IVOR (GLOVE) ×4 IMPLANT
GOWN STRL REUS W/ TWL LRG LVL3 (GOWN DISPOSABLE) ×6 IMPLANT
GOWN STRL REUS W/ TWL XL LVL3 (GOWN DISPOSABLE) ×2 IMPLANT
GOWN STRL REUS W/TWL LRG LVL3 (GOWN DISPOSABLE) ×9
GOWN STRL REUS W/TWL XL LVL3 (GOWN DISPOSABLE) ×6
KIT TURNOVER KIT B (KITS) ×3 IMPLANT
LIGASURE IMPACT 36 18CM CVD LR (INSTRUMENTS) ×2 IMPLANT
NS IRRIG 1000ML POUR BTL (IV SOLUTION) ×6 IMPLANT
PACK COLON (CUSTOM PROCEDURE TRAY) ×3 IMPLANT
PAD ARMBOARD 7.5X6 YLW CONV (MISCELLANEOUS) ×6 IMPLANT
PENCIL SMOKE EVACUATOR (MISCELLANEOUS) ×4 IMPLANT
RELOAD PROXIMATE 75MM BLUE (ENDOMECHANICALS) ×6 IMPLANT
RELOAD STAPLE 75 3.8 BLU REG (ENDOMECHANICALS) IMPLANT
RETRACTOR WND ALEXIS 18 MED (MISCELLANEOUS) IMPLANT
RTRCTR WOUND ALEXIS 18CM MED (MISCELLANEOUS)
SCISSORS LAP 5X35 DISP (ENDOMECHANICALS) ×1 IMPLANT
SET IRRIG TUBING LAPAROSCOPIC (IRRIGATION / IRRIGATOR) IMPLANT
SET TUBE SMOKE EVAC HIGH FLOW (TUBING) ×3 IMPLANT
SHEARS HARMONIC ACE PLUS 36CM (ENDOMECHANICALS) ×2 IMPLANT
SLEEVE ENDOPATH XCEL 5M (ENDOMECHANICALS) ×3 IMPLANT
SLEEVE SUCTION CATH 165 (SLEEVE) ×2 IMPLANT
SPECIMEN JAR LARGE (MISCELLANEOUS) ×1 IMPLANT
SPONGE GAUZE 2X2 STER 10/PKG (GAUZE/BANDAGES/DRESSINGS) ×2
SPONGE LAP 18X18 RF (DISPOSABLE) ×6 IMPLANT
STAPLER GUN LINEAR PROX 60 (STAPLE) ×2 IMPLANT
STAPLER PROXIMATE 75MM BLUE (STAPLE) ×2 IMPLANT
STAPLER VISISTAT 35W (STAPLE) ×3 IMPLANT
SURGILUBE 2OZ TUBE FLIPTOP (MISCELLANEOUS) IMPLANT
SUT PDS AB 1 TP1 96 (SUTURE) ×6 IMPLANT
SUT PROLENE 2 0 CT2 30 (SUTURE) IMPLANT
SUT PROLENE 2 0 KS (SUTURE) IMPLANT
SUT VIC AB 2-0 SH 18 (SUTURE) ×3 IMPLANT
SUT VIC AB 3-0 SH 18 (SUTURE) ×3 IMPLANT
SUT VICRYL AB 2 0 TIES (SUTURE) ×3 IMPLANT
SUT VICRYL AB 3 0 TIES (SUTURE) ×3 IMPLANT
SYS LAPSCP GELPORT 120MM (MISCELLANEOUS) ×3
SYSTEM LAPSCP GELPORT 120MM (MISCELLANEOUS) IMPLANT
TRAY FOLEY MTR SLVR 16FR STAT (SET/KITS/TRAYS/PACK) ×3 IMPLANT
TRAY PROCTOSCOPIC FIBER OPTIC (SET/KITS/TRAYS/PACK) IMPLANT
TROCAR 5MMX150MM (TROCAR) ×2 IMPLANT
TROCAR XCEL 12X100 BLDLESS (ENDOMECHANICALS) IMPLANT
TROCAR XCEL BLUNT TIP 100MML (ENDOMECHANICALS) IMPLANT
TROCAR XCEL NON-BLD 11X100MML (ENDOMECHANICALS) IMPLANT
TROCAR XCEL NON-BLD 5MMX100MML (ENDOMECHANICALS) ×5 IMPLANT
TUBE CONNECTING 12'X1/4 (SUCTIONS) ×2
TUBE CONNECTING 12X1/4 (SUCTIONS) ×4 IMPLANT
WATER STERILE IRR 1000ML POUR (IV SOLUTION) ×3 IMPLANT

## 2019-07-23 NOTE — Interval H&P Note (Signed)
History and Physical Interval Note:  07/23/2019 8:54 AM  William Livingston  has presented today for surgery, with the diagnosis of COLON MASS.  The various methods of treatment have been discussed with the patient and family. After consideration of risks, benefits and other options for treatment, the patient has consented to  Procedure(s): LAPAROSCOPIC PARTIAL COLECTOMY (N/A) as a surgical intervention.  The patient's history has been reviewed, patient examined, no change in status, stable for surgery.  I have reviewed the patient's chart and labs.  Questions were answered to the patient's satisfaction.     [place]

## 2019-07-23 NOTE — Consult Note (Addendum)
[facility] [place] Nurse requested for preoperative stoma site marking  Discussed surgical procedure and possible stoma creation with patient.  Explained role of the [facility] nurse team.  Provided the patient with educational booklet and provided samples of pouching options.  Answered patient's questions.   Examined patient lying and sitting upright in order to place the marking in the patient's visual field, away from any creases or abdominal contour issues and within the rectus muscle.  Attempted to [place] below the patient's belt line.   Marked for colostomy in the LLQ  __7__ cm to the left of the umbilicus and [id number] below the umbilicus.  Marked for ileostomy in the RLQ  _7___cm to the right of the umbilicus and  0000000 cm /below the umbilicus.  Patient's abdomen cleansed with CHG wipes at site markings, allowed to air dry prior to marking. Pt is going to surgery immediately this am. [place] Nurse team will follow up with patient after surgery for continued ostomy care and teaching if he receives an ostomy.  Julien Girt MSN, RN, [place], [place], [place]

## 2019-07-23 NOTE — Anesthesia Postprocedure Evaluation (Signed)
Anesthesia Post Note  Patient: William Livingston  Procedure(s) Performed: ATTEMPTED LAPAROSCOPIC RIGHT COLECTOMY, OPEN RIGHT COLECTOMY (N/A ) Cholecystectomy (N/A )     Patient location during evaluation: PACU Anesthesia Type: General Level of consciousness: awake and alert Pain management: pain level controlled Vital Signs Assessment: post-procedure vital signs reviewed and stable Respiratory status: spontaneous breathing, nonlabored ventilation and respiratory function stable Cardiovascular status: blood pressure returned to baseline and stable Postop Assessment: no apparent nausea or vomiting Anesthetic complications: no    Last Vitals:  Vitals:   07/23/19 1254 07/23/19 1308  BP: 123/69 127/75  Pulse: 80 99  Resp: 13 13  Temp:    SpO2: 93% 92%    Last Pain:  Vitals:   07/23/19 1317  TempSrc:   PainSc: [place]

## 2019-07-23 NOTE — H&P (View-Only) (Signed)
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  General appearance: alert and cooperative GI: soft, non-tender; bowel sounds normal; no masses,  no organomegaly Neurologic: Grossly normal  Lab Results:  Recent Labs    07/22/19 0254 07/23/19 0240  WBC 6.2 5.7  HGB 9.3* 9.6*  HCT 30.9* 33.4*  PLT 488* 504*   BMET Recent Labs    07/23/19 0240  NA 136  K 4.1  CL 100  CO2 26  GLUCOSE 104*  BUN 7  CREATININE 0.80  CALCIUM 8.5*   PT/INR No results for input(s): LABPROT, INR in the last 72 hours. ABG No results for input(s): PHART, HCO3 in the last 72 hours.  Invalid input(s): PCO2, PO2  Studies/Results: No results found.  Anti-infectives: Anti-infectives (From admission, onward)   Start     Dose/Rate Route Frequency Ordered Stop   07/22/19 1032  clindamycin (CLEOCIN) IVPB 900 mg     900 mg 100 mL/hr over 30 Minutes Intravenous 60 min pre-op 07/22/19 1033     07/22/19 1032  gentamicin (GARAMYCIN) 410 mg in dextrose 5 % 100 mL IVPB     5 mg/kg  82.1 kg 110.3 mL/hr over 60 Minutes Intravenous 60 min pre-op 07/22/19 1033     07/18/19 0130  piperacillin-tazobactam (ZOSYN) IVPB 3.375 g  Status:  Discontinued     3.375 g 12.5 mL/hr over 240 Minutes Intravenous Every 8 hours 07/17/19 1853 07/20/19  1225   07/17/19 1900  piperacillin-tazobactam (ZOSYN) IVPB 3.375 g     3.375 g 100 mL/hr over 30 Minutes Intravenous  Once 07/17/19 1850 07/17/19 2327      Assessment/Plan: hepatic colon cancer  Laparoscopic partial colectomy   The procedure was discussed with the patient.  Laparoscopic partial colectomy discussed with the patient as well as non operative treatments. The risks of operative management include bleeding,  Infection,  Leak of anastamosis,  Ostomy formation, open procedure,  Sepsis,  Abcess,  Hernia,  DVT,  Pulmonary complications,  Cardiovascular  complications,  Injury to ureter,  Bladder,kidney,and anesthesia risks,  And death. The patient understands.  Questions answered.   The success of the procedure is 50-85 % for treating the patients symptoms. They agree to proceed.   LOS: 6 days    Joyice Faster Samie Reasons 07/23/2019

## 2019-07-23 NOTE — Op Note (Signed)
Preoperative diagnosis: Right colon cancer hepatic flexure  Postoperative diagnosis: Same with gallstones  Procedure: Laparoscopic converted to open partial colectomy and open cholecystectomy  Surgeon: Erroll Luna, MD  Anesthesia: General with 0.5% bupivacaine local with epinephrine  EBL: 70 cc  Drains: None  Specimen: Right colon with large hepatic flexure mass to pathology and gallbladder with gallstones to pathology  IV fluids: Per anesthesia record  Indications for procedure: The patient is a 38 year old male admitted to the hospital last week due to abdominal pain.  CT scan showed a very large hepatic flexure colon mass.  He underwent colonoscopy which showed adenocarcinoma which was poorly differentiated.  Metastatic work-up revealed no evidence of metastatic disease.  He had 2 of the polyps one in the sigmoid colon 1 in his rectum that were tubular adenomas.  He has been bowel prepped and presents today for partial colectomy for near obstructing colon cancer.The procedure was discussed with the patient.  Laparoscopic partial colectomy discussed with the patient as well as non operative treatments. The risks of operative management include bleeding,  Infection,  Leak of anastamosis,  Ostomy formation, open procedure,  Sepsis,  Abcess,  Hernia,  DVT,  Pulmonary complications,  Cardiovascular  complications,  Injury to ureter,  Bladder,kidney,and anesthesia risks,  And death. The patient understands.  Questions answered.   The success of the procedure is 50-85 % for treating the patients symptoms. They agree to proceed.  Description of procedure: The patient was seen in the holding area.  Questions were answered.  He was brought back to the operative room placed supine upon the operating table.  After induction of general anesthesia, both arms were tucked and a Foley catheter was placed under sterile conditions.  His abdomen was then prepped and draped in sterile fashion timeout was  performed.  He received appropriate preoperative antibiotics.  A 5 mm U Optiview port was placed in the patient's left upper quadrant under direct vision.  This was passed to the abdominal wall and all areas were visualized without trauma to the internal viscera.  Pneumoperitoneum was then created to 15 mmHg CO2.  2 additional 5 mm ports were placed in the left lower quadrant in the left mid abdomen.  Camera was placed.  Four-quadrant laparoscopy revealed no carcinomatosis or no evidence of liver disease.  The large mass was tattooed and easily visualized in the hepatic flexure.  Harmonic scalpel was used and the right colon was mobilized along the white line of [place] and lateral to medial fashion.  This was done up to the mass.  Transverse colon was then mobilized medial to lateral with the omentum being mobilized off of it.  Once again the tumor is quite large and very difficult to manipulate laparoscopically.  I placed a midline GelPort.  I then inserted my hand through this again to mobilize the tumor but it was just adherent to the retroperitoneum.  I dissected what I could with the harmonic but I could not easily see the duodenum this was densely adherent to it.  At this point portion the case opted open.  A laparoscopic gastric removed and passed off the field.  CO2 allowed to escape.  I removed the GelPort made this incision larger toward the xiphoid.  I then placed a retractor.  I could easily see the mass and rolled over.  I carefully dissected off the retroperitoneum and identify the duodenum which was not injured.  This was not invading the duodenum either.  Once I fully mobilized  the colon under direct vision I divided the colon just distal to the mass in the proximal transverse colon with a GIA 75 stapler.  A same level was used to fire across the terminal ileum.  I then used the LigaSure to dissect the mesentery out and removed the specimen.  There are multiple large nodes in the mesentery.  This was  passed off the field.  I then examined the gallbladder.  There are multiple gallstones and signs of chronic cholecystitis.  Given the findings opted for open cholecystectomy to prevent any issues with this down the road given his large tumor in and possible need for chemotherapy.  This was done the dome down fashion.  The gallbladder was mobilized off the liver bed with cautery.  This was taken away down to the cystic artery was clipped and divided.  In a dome down fashion we identified the cystic duct.  Any stones were pulled pulled back into the gallbladder.  This was double clipped and divided at the junction of the gallbladder neck.  This passed off the field.  Hemostasis achieved and no evidence of bile leakage.  The common bile duct was actually visualized and was not injured.  We irrigated out the right upper quadrant.  We then performed a side-to-side functional end-to-end anastomosis using a GIA 75 stapler and a TA 60 for the enterotomy between the terminal ileum and transverse colon.  Stay sutures were placed on both sides.  The staples were fired there is no signs of bleeding the staple line.  A TA 60 was used to close the enterotomy.  Mesenteric defect closed with 2-0 Vicryl.  A crotch stitch was placed in the anastomosis.  The anastomosis was knocking to [place] under tension.  There were ample room for 2 fingerbreadths through it.  This was placed back in her upper quadrant.  Small bowel was run and no evidence of small bowel injury.  No other masses or obstructing lesion.  The anastomosis was pink and viable.  Remainder the colon appeared normal.  The liver was normal.  Stomach was normal.  This portion of the case we closed the fascia with double-stranded PDS.  Stent skin closed staples.  Port sites closed with staples.  All final counts found to be correct.  The patient was then awoke extubated taken recovery in satisfactory condition.  All counts were correct.

## 2019-07-23 NOTE — Progress Notes (Signed)
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  General appearance: alert and cooperative GI: soft, non-tender; bowel sounds normal; no masses,  no organomegaly Neurologic: Grossly normal  Lab Results:  Recent Labs    07/22/19 0254 07/23/19 0240  WBC 6.2 5.7  HGB 9.3* 9.6*  HCT 30.9* 33.4*  PLT 488* 504*   BMET Recent Labs    07/23/19 0240  NA 136  K 4.1  CL 100  CO2 26  GLUCOSE 104*  BUN 7  CREATININE 0.80  CALCIUM 8.5*   PT/INR No results for input(s): LABPROT, INR in the last 72 hours. ABG No results for input(s): PHART, HCO3 in the last 72 hours.  Invalid input(s): PCO2, PO2  Studies/Results: No results found.  Anti-infectives: Anti-infectives (From admission, onward)   Start     Dose/Rate Route Frequency Ordered Stop   07/22/19 1032  clindamycin (CLEOCIN) IVPB 900 mg     900 mg 100 mL/hr over 30 Minutes Intravenous 60 min pre-op 07/22/19 1033     07/22/19 1032  gentamicin (GARAMYCIN) 410 mg in dextrose 5 % 100 mL IVPB     5 mg/kg  82.1 kg 110.3 mL/hr over 60 Minutes Intravenous 60 min pre-op 07/22/19 1033     07/18/19 0130  piperacillin-tazobactam (ZOSYN) IVPB 3.375 g  Status:  Discontinued     3.375 g 12.5 mL/hr over 240 Minutes Intravenous Every 8 hours 07/17/19 1853 07/20/19  1225   07/17/19 1900  piperacillin-tazobactam (ZOSYN) IVPB 3.375 g     3.375 g 100 mL/hr over 30 Minutes Intravenous  Once 07/17/19 1850 07/17/19 2327      Assessment/Plan: hepatic colon cancer  Laparoscopic partial colectomy   The procedure was discussed with the patient.  Laparoscopic partial colectomy discussed with the patient as well as non operative treatments. The risks of operative management include bleeding,  Infection,  Leak of anastamosis,  Ostomy formation, open procedure,  Sepsis,  Abcess,  Hernia,  DVT,  Pulmonary complications,  Cardiovascular  complications,  Injury to ureter,  Bladder,kidney,and anesthesia risks,  And death. The patient understands.  Questions answered.   The success of the procedure is 50-85 % for treating the patients symptoms. They agree to proceed.   LOS: 6 days    Joyice Faster Marvia Troost 07/23/2019

## 2019-07-23 NOTE — Anesthesia Procedure Notes (Signed)
Procedure Name: Intubation Date/Time: 07/23/2019 9:34 AM Performed by: Imagene Riches, CRNA Pre-anesthesia Checklist: Patient identified, Emergency Drugs available, Suction available and Patient being monitored Patient Re-evaluated:Patient Re-evaluated prior to induction Oxygen Delivery Method: Circle System Utilized Preoxygenation: Pre-oxygenation with 100% oxygen Induction Type: IV induction Ventilation: Mask ventilation without difficulty Laryngoscope Size: Miller and 2 Grade View: Grade I Tube type: Oral Tube size: 7.5 mm Number of attempts: 1 Airway Equipment and Method: Stylet and Oral airway Placement Confirmation: ETT inserted through vocal cords under direct vision,  positive ETCO2 and breath sounds checked- equal and bilateral Secured at: 23 cm Tube secured with: Tape Dental Injury: Teeth and Oropharynx as per pre-operative assessment

## 2019-07-23 NOTE — Anesthesia Preprocedure Evaluation (Signed)
Anesthesia Evaluation  Patient identified by MRN, date of birth, ID band Patient awake    Reviewed: Allergy & Precautions, NPO status , Patient's Chart, lab work & pertinent test results  History of Anesthesia Complications Negative for: history of anesthetic complications  Airway Mallampati: I  TM Distance: >3 FB Neck ROM: Full    Dental no notable dental hx. (+) Dental Advisory Given, Teeth Intact   Pulmonary neg pulmonary ROS,    Pulmonary exam normal breath sounds clear to auscultation       Cardiovascular negative cardio ROS Normal cardiovascular exam Rhythm:Regular Rate:Normal     Neuro/Psych negative neurological ROS  negative psych ROS   GI/Hepatic Neg liver ROS,  Colon mass    Endo/Other  negative endocrine ROS  Renal/GU negative Renal ROS     Musculoskeletal negative musculoskeletal ROS (+)   Abdominal   Peds  Hematology  (+) anemia ,   Anesthesia Other Findings Colon mass  Reproductive/Obstetrics                             Anesthesia Physical  Anesthesia Plan  ASA: II  Anesthesia Plan: General   Post-op Pain Management:    Induction: Intravenous  PONV Risk Score and Plan: 2 and Treatment may vary due to age or medical condition, Ondansetron and Midazolam  Airway Management Planned: Oral ETT  Additional Equipment: None  Intra-op Plan:   Post-operative Plan: Extubation in OR  Informed Consent: I have reviewed the patients History and Physical, chart, labs and discussed the procedure including the risks, benefits and alternatives for the proposed anesthesia with the patient or authorized representative who has indicated his/her understanding and acceptance.       Plan Discussed with: CRNA and Anesthesiologist  Anesthesia Plan Comments:         Anesthesia Quick Evaluation

## 2019-07-23 NOTE — Transfer of Care (Signed)
Immediate Anesthesia Transfer of Care Note  Patient: William Livingston  Procedure(s) Performed: ATTEMPTED LAPAROSCOPIC RIGHT COLECTOMY, OPEN RIGHT COLECTOMY (N/A ) Cholecystectomy (N/A )  Patient Location: PACU  Anesthesia Type:General  Level of Consciousness: awake, alert  and oriented  Airway & Oxygen Therapy: Patient Spontanous Breathing and Patient connected to nasal cannula oxygen  Post-op Assessment: Report given to RN and Post -op Vital signs reviewed and stable  Post vital signs: Reviewed and stable  Last Vitals:  Vitals Value Taken Time  BP 127/75 07/23/19 1224  Temp    Pulse 76 07/23/19 1226  Resp 10 07/23/19 1226  SpO2 98 % 07/23/19 1226  Vitals shown include unvalidated device data.  Last Pain:  Vitals:   07/23/19 0453  TempSrc: Oral  PainSc:       Patients Stated Pain Goal: 2 ([id number])  Complications: No apparent anesthesia complications

## 2019-07-23 NOTE — Consult Note (Signed)
WOC follow-up:  Patient did not receive an ostomy today during surgery. Please re-consult if further assistance is needed.  Thank-you,  Julien Girt MSN, [place], [place], [place], [place]

## 2019-07-24 ENCOUNTER — Encounter (HOSPITAL_COMMUNITY): Payer: Self-pay | Admitting: Surgery

## 2019-07-24 LAB — CBC
HCT: 30.3 % — ABNORMAL LOW (ref 39.0–52.0)
Hemoglobin: 8.9 g/dL — ABNORMAL LOW (ref 13.0–17.0)
MCH: 19.5 pg — ABNORMAL LOW (ref 26.0–34.0)
MCHC: 29.4 g/dL — ABNORMAL LOW (ref 30.0–36.0)
MCV: 66.4 fL — ABNORMAL LOW (ref 80.0–100.0)
Platelets: 484 10*3/uL — ABNORMAL HIGH (ref 150–400)
RBC: 4.56 MIL/uL (ref 4.22–5.81)
RDW: 24 % — ABNORMAL HIGH (ref 11.5–15.5)
WBC: 7.4 10*3/uL (ref 4.0–10.5)
nRBC: 0 % (ref 0.0–0.2)

## 2019-07-24 LAB — COMPREHENSIVE METABOLIC PANEL
ALT: 21 U/L (ref 0–44)
AST: 23 U/L (ref 15–41)
Albumin: 2.7 g/dL — ABNORMAL LOW (ref 3.5–5.0)
Alkaline Phosphatase: 54 U/L (ref 38–126)
Anion gap: 10 (ref 5–15)
BUN: 11 mg/dL (ref 6–20)
CO2: 26 mmol/L (ref 22–32)
Calcium: 8.4 mg/dL — ABNORMAL LOW (ref 8.9–10.3)
Chloride: 102 mmol/L (ref 98–111)
Creatinine, Ser: 0.88 mg/dL (ref 0.61–1.24)
GFR calc Af Amer: >60 mL/min (ref >60)
GFR calc non Af Amer: >60 mL/min (ref >60)
Glucose, Bld: 111 mg/dL — ABNORMAL HIGH (ref 70–99)
Potassium: 4.1 mmol/L (ref 3.5–5.1)
Sodium: 138 mmol/L (ref 135–145)
Total Bilirubin: 0.5 mg/dL (ref 0.3–1.2)
Total Protein: 6 g/dL — ABNORMAL LOW (ref 6.5–8.1)

## 2019-07-24 MED ORDER — BACITRACIN ZINC 500 UNIT/GM EX OINT
1.0000 "application " | TOPICAL_OINTMENT | Freq: Two times a day (BID) | CUTANEOUS | Status: DC
  Administered 2019-07-24: 1 via TOPICAL
  Filled 2019-07-24: qty 28.35

## 2019-07-24 MED ORDER — ACETAMINOPHEN 10 MG/ML IV SOLN
1000.0000 mg | Freq: Four times a day (QID) | INTRAVENOUS | Status: AC
  Administered 2019-07-24 – 2019-07-25 (×4): 1000 mg via INTRAVENOUS
  Filled 2019-07-24 (×4): qty 100

## 2019-07-24 MED ORDER — METHOCARBAMOL 1000 MG/10ML IJ SOLN
500.0000 mg | Freq: Four times a day (QID) | INTRAVENOUS | Status: DC | PRN
  Filled 2019-07-24: qty 5

## 2019-07-24 MED ORDER — CHLORHEXIDINE GLUCONATE CLOTH 2 % EX PADS
6.0000 | MEDICATED_PAD | Freq: Every day | CUTANEOUS | Status: DC
  Administered 2019-07-24 – 2019-07-31 (×7): 6 via TOPICAL

## 2019-07-24 NOTE — Progress Notes (Signed)
[facility] Progress Note  1 Day Post-Op  Subjective: CC-  Wife at bedside. Feeling ok. Abdomen sore but pain manageable. PCA helping. Denies any n/v. No flatus or BM. No appetite. Ambulated 1 lap in the hall this morning. Unable to urinate to foley removal.   Objective: Vital signs in last 24 hours: Temp:  [97.6 F (36.4 C)-98.6 F (37 C)] 98.2 F (36.8 C) (09/29 0441) Pulse Rate:  [79-100] 97 (09/29 0441) Resp:  [11-19] 14 (09/29 0441) BP: (107-128)/(64-79) 110/72 (09/29 0441) SpO2:  [91 %-99 %] 97 % (09/29 0441) Weight:  [82.1 kg] 82.1 kg (09/28 0852) Last BM Date: 07/19/19  Intake/Output from previous day: 09/28 0701 - 09/29 0700 In: 3014.1 [I.V.:3014.1] Out: 2155 [Urine:2055; Blood:100] Intake/Output this shift: No intake/output data recorded.  PE: Gen:  Alert, NAD, pleasant HEENT: EOM's intact, pupils equal and round Card:  RRR Pulm:  Rate and effort normal Abd: Soft, ND, appropriately tender, few BS heard, midline incision cdi with honeycomb dressing in place/ trace dried blood on dressings, lap incisions with cdi dressings Skin: warm and dry  Lab Results:  Recent Labs    07/22/19 0254 07/23/19 0240  WBC 6.2 5.7  HGB 9.3* 9.6*  HCT 30.9* 33.4*  PLT 488* 504*   BMET Recent Labs    07/23/19 0240  NA 136  K 4.1  CL 100  CO2 26  GLUCOSE 104*  BUN 7  CREATININE 0.80  CALCIUM 8.5*   PT/INR No results for input(s): LABPROT, INR in the last 72 hours. CMP     Component Value Date/Time   NA 136 07/23/2019 0240   K 4.1 07/23/2019 0240   CL 100 07/23/2019 0240   CO2 26 07/23/2019 0240   GLUCOSE 104 (H) 07/23/2019 0240   BUN 7 07/23/2019 0240   CREATININE 0.80 07/23/2019 0240   CALCIUM 8.5 (L) 07/23/2019 0240   PROT 7.2 07/17/2019 1142   ALBUMIN 3.5 07/17/2019 1142   AST 15 07/17/2019 1142   ALT 13 07/17/2019 1142   ALKPHOS 76 07/17/2019 1142   BILITOT 0.7 07/17/2019 1142   GFRNONAA >60 07/23/2019 0240   GFRAA >60 07/23/2019  0240   Lipase     Component Value Date/Time   LIPASE 20 07/17/2019 1142       Studies/Results: No results found.  Anti-infectives: Anti-infectives (From admission, onward)   Start     Dose/Rate Route Frequency Ordered Stop   07/23/19 0900  clindamycin (CLEOCIN) IVPB 900 mg     900 mg 100 mL/hr over 30 Minutes Intravenous To ShortStay Surgical 07/23/19 0850 07/23/19 1008   07/23/19 0847  gentamicin (GARAMYCIN) 410 mg in dextrose 5 % 100 mL IVPB  Status:  Discontinued     5 mg/kg  82.1 kg 110.3 mL/hr over 60 Minutes Intravenous 60 min pre-op 07/23/19 0848 07/23/19 1350   07/22/19 1032  clindamycin (CLEOCIN) IVPB 900 mg  Status:  Discontinued     900 mg 100 mL/hr over 30 Minutes Intravenous 60 min pre-op 07/22/19 1033 07/23/19 1358   07/22/19 1032  gentamicin (GARAMYCIN) 410 mg in dextrose 5 % 100 mL IVPB     5 mg/kg  82.1 kg 110.3 mL/hr over 60 Minutes Intravenous 60 min pre-op 07/22/19 1033 07/23/19 1008   07/18/19 0130  piperacillin-tazobactam (ZOSYN) IVPB 3.375 g  Status:  Discontinued     3.375 g 12.5 mL/hr over 240 Minutes Intravenous Every 8 hours 07/17/19 1853 07/20/19 1225   07/17/19 1900  piperacillin-tazobactam (ZOSYN) IVPB 3.375  g     3.375 g 100 mL/hr over 30 Minutes Intravenous  Once 07/17/19 1850 07/17/19 2327       Assessment/Plan Acute blood loss anemia -CBC pending  Right colon cancer hepatic flexure, gallstones S/p Laparoscopic converted to open partial colectomy and open cholecystectomy 9/28 Dr. Brantley Stage - POD#1 - CEA1.7 (9/23) - surgical pathology pending - Ok for clear liquids, but await return in bowel function before advancing. Mobilize. Replace foley for urinary retention. Continue PCA and add IV tylenol for pain control. Labs pending.  ID -zosyn 9/22>>9/25, clinda/gent periop x1 FEN -IVF,CLD VTE -SCDs, lovenox Foley - replace 9/29 Follow up -Dr. Brantley Stage, oncology   LOS: 7 days    Wellington [place] , [facility]  Surgery 07/24/2019, 8:43 AM Pager: [phone] Mon-Thurs 7:00 am-4:30 pm Fri 7:00 am -11:30 AM Sat-Sun 7:00 am-11:30 am

## 2019-07-24 NOTE — Progress Notes (Signed)
Patient requested to hold foley catheter insertion for now, he wanted to keep trying to void on his own.

## 2019-07-24 NOTE — Progress Notes (Signed)
Patient unable to void since foley cath removed last night despite several attempts in the bathroom, ambulated in hall but still unable to void.  On call [facility] was made aware and Dr. Mamie Laurel called back and ordered to replace foley catheter.  Will endorse to day shift RN accordingly.

## 2019-07-25 LAB — CBC
HCT: 27.1 % — ABNORMAL LOW (ref 39.0–52.0)
Hemoglobin: 7.9 g/dL — ABNORMAL LOW (ref 13.0–17.0)
MCH: 19.4 pg — ABNORMAL LOW (ref 26.0–34.0)
MCHC: 29.2 g/dL — ABNORMAL LOW (ref 30.0–36.0)
MCV: 66.6 fL — ABNORMAL LOW (ref 80.0–100.0)
Platelets: 425 10*3/uL — ABNORMAL HIGH (ref 150–400)
RBC: 4.07 MIL/uL — ABNORMAL LOW (ref 4.22–5.81)
RDW: 23.9 % — ABNORMAL HIGH (ref 11.5–15.5)
WBC: 5.3 10*3/uL (ref 4.0–10.5)
nRBC: 0 % (ref 0.0–0.2)

## 2019-07-25 LAB — BASIC METABOLIC PANEL
Anion gap: 6 (ref 5–15)
BUN: 8 mg/dL (ref 6–20)
CO2: 27 mmol/L (ref 22–32)
Calcium: 8 mg/dL — ABNORMAL LOW (ref 8.9–10.3)
Chloride: 104 mmol/L (ref 98–111)
Creatinine, Ser: 0.77 mg/dL (ref 0.61–1.24)
GFR calc Af Amer: >60 mL/min (ref >60)
GFR calc non Af Amer: >60 mL/min (ref >60)
Glucose, Bld: 112 mg/dL — ABNORMAL HIGH (ref 70–99)
Potassium: 3.9 mmol/L (ref 3.5–5.1)
Sodium: 137 mmol/L (ref 135–145)

## 2019-07-25 MED ORDER — KETOROLAC TROMETHAMINE 30 MG/ML IJ SOLN
30.0000 mg | Freq: Four times a day (QID) | INTRAMUSCULAR | Status: AC | PRN
  Administered 2019-07-25 – 2019-07-27 (×3): 30 mg via INTRAVENOUS
  Filled 2019-07-25 (×4): qty 1

## 2019-07-25 MED ORDER — MUPIROCIN 2 % EX OINT
TOPICAL_OINTMENT | Freq: Two times a day (BID) | CUTANEOUS | Status: DC
  Administered 2019-07-25 – 2019-07-31 (×12): via NASAL
  Filled 2019-07-25 (×3): qty 22

## 2019-07-25 MED ORDER — TAMSULOSIN HCL 0.4 MG PO CAPS
0.4000 mg | ORAL_CAPSULE | Freq: Every day | ORAL | Status: DC
  Administered 2019-07-25 – 2019-07-30 (×6): 0.4 mg via ORAL
  Filled 2019-07-25 (×6): qty 1

## 2019-07-25 NOTE — Progress Notes (Signed)
2 Days Post-Op   Subjective/Chief Complaint: No flatus  Ambulating in  the halls     Objective: Vital signs in last 24 hours: Temp:  [97.8 F (36.6 C)-98.6 F (37 C)] 97.9 F (36.6 C) (09/30 0543) Pulse Rate:  [50-73] 50 (09/30 0543) Resp:  [13-19] 15 (09/30 0424) BP: (93-115)/(57-67) 108/65 (09/30 0543) SpO2:  [91 %-98 %] 98 % (09/30 0543) Last BM Date: 07/19/19  Intake/Output from previous day: 09/29 0701 - 09/30 0700 In: 2619.5 [P.O.:120; I.V.:2199.5; IV Piggyback:300] Out: 950 [Urine:950] Intake/Output this shift: No intake/output data recorded.   Gen:  Alert, NAD, pleasant HEENT: EOM's intact, pupils equal and round Card:  RRR Pulm:  Rate and effort normal Abd: Soft, ND, appropriately tender, few BS heard, midline incision cdi with honeycomb dressing in place/ trace dried blood on dressings, lap incisions with cdi dressings Skin: warm and dry Lab Results:  Recent Labs    07/24/19 0943 07/25/19 0135  WBC 7.4 5.3  HGB 8.9* 7.9*  HCT 30.3* 27.1*  PLT 484* 425*   BMET Recent Labs    07/24/19 0943 07/25/19 0135  NA 138 137  K 4.1 3.9  CL 102 104  CO2 26 27  GLUCOSE 111* 112*  BUN 11 8  CREATININE 0.88 0.77  CALCIUM 8.4* 8.0*   PT/INR No results for input(s): LABPROT, INR in the last 72 hours. ABG No results for input(s): PHART, HCO3 in the last 72 hours.  Invalid input(s): PCO2, PO2  Studies/Results: No results found.  Anti-infectives: Anti-infectives (From admission, onward)   Start     Dose/Rate Route Frequency Ordered Stop   07/23/19 0900  clindamycin (CLEOCIN) IVPB 900 mg     900 mg 100 mL/hr over 30 Minutes Intravenous To ShortStay Surgical 07/23/19 0850 07/23/19 1008   07/23/19 0847  gentamicin (GARAMYCIN) 410 mg in dextrose 5 % 100 mL IVPB  Status:  Discontinued     5 mg/kg  82.1 kg 110.3 mL/hr over 60 Minutes Intravenous 60 min pre-op 07/23/19 0848 07/23/19 1350   07/22/19 1032  clindamycin (CLEOCIN) IVPB 900 mg  Status:   Discontinued     900 mg 100 mL/hr over 30 Minutes Intravenous 60 min pre-op 07/22/19 1033 07/23/19 1358   07/22/19 1032  gentamicin (GARAMYCIN) 410 mg in dextrose 5 % 100 mL IVPB     5 mg/kg  82.1 kg 110.3 mL/hr over 60 Minutes Intravenous 60 min pre-op 07/22/19 1033 07/23/19 1008   07/18/19 0130  piperacillin-tazobactam (ZOSYN) IVPB 3.375 g  Status:  Discontinued     3.375 g 12.5 mL/hr over 240 Minutes Intravenous Every 8 hours 07/17/19 1853 07/20/19 1225   07/17/19 1900  piperacillin-tazobactam (ZOSYN) IVPB 3.375 g     3.375 g 100 mL/hr over 30 Minutes Intravenous  Once 07/17/19 1850 07/17/19 2327      Assessment/Plan: s/p Procedure(s): ATTEMPTED LAPAROSCOPIC RIGHT COLECTOMY, OPEN RIGHT COLECTOMY (N/A) Cholecystectomy (N/A) Acute blood loss anemia -CBC pending Hgb 7.9   Right colon cancer hepatic flexure, gallstones S/p Laparoscopic converted to open partial colectomy and open cholecystectomy 9/28 Dr. Brantley Stage - POD#2 - CEA1.7 (9/23) - surgical pathology pending - Ok for clear liquids, but await return in bowel function before advancing. Mobilize. Replace foley for urinary retention. Continue PCA and add IV tylenol for pain control.  ID -zosyn 9/22>>9/25, clinda/gent periop x1 FEN -IVF,CLD VTE -SCDs, lovenox Foley - d/c  Start flomax  Follow up -Dr. Brantley Stage, oncology   LOS: 8 days    [place]  07/25/2019 

## 2019-07-26 ENCOUNTER — Other Ambulatory Visit: Payer: Self-pay

## 2019-07-26 LAB — CBC
HCT: 27.7 % — ABNORMAL LOW (ref 39.0–52.0)
Hemoglobin: 8.1 g/dL — ABNORMAL LOW (ref 13.0–17.0)
MCH: 19.4 pg — ABNORMAL LOW (ref 26.0–34.0)
MCHC: 29.2 g/dL — ABNORMAL LOW (ref 30.0–36.0)
MCV: 66.4 fL — ABNORMAL LOW (ref 80.0–100.0)
Platelets: 446 10*3/uL — ABNORMAL HIGH (ref 150–400)
RBC: 4.17 MIL/uL — ABNORMAL LOW (ref 4.22–5.81)
RDW: 23.9 % — ABNORMAL HIGH (ref 11.5–15.5)
WBC: 4.4 10*3/uL (ref 4.0–10.5)
nRBC: 0 % (ref 0.0–0.2)

## 2019-07-26 MED ORDER — OXYCODONE HCL 5 MG PO TABS
5.0000 mg | ORAL_TABLET | ORAL | Status: DC | PRN
  Administered 2019-07-26 (×4): 10 mg via ORAL
  Filled 2019-07-26 (×4): qty 2

## 2019-07-26 MED ORDER — METHOCARBAMOL 500 MG PO TABS
500.0000 mg | ORAL_TABLET | Freq: Four times a day (QID) | ORAL | Status: DC | PRN
  Administered 2019-07-26 (×3): 500 mg via ORAL
  Filled 2019-07-26 (×3): qty 1

## 2019-07-26 MED ORDER — ALUM & MAG HYDROXIDE-SIMETH 200-200-20 MG/5ML PO SUSP
30.0000 mL | ORAL | Status: DC | PRN
  Administered 2019-07-26: 30 mL via ORAL
  Filled 2019-07-26: qty 30

## 2019-07-26 MED ORDER — ACETAMINOPHEN 500 MG PO TABS
1000.0000 mg | ORAL_TABLET | Freq: Four times a day (QID) | ORAL | Status: DC
  Administered 2019-07-26 – 2019-07-30 (×13): 1000 mg via ORAL
  Filled 2019-07-26 (×15): qty 2

## 2019-07-26 MED ORDER — HYDROMORPHONE HCL 1 MG/ML IJ SOLN
0.5000 mg | INTRAMUSCULAR | Status: DC | PRN
  Administered 2019-07-26: 1 mg via INTRAVENOUS
  Filled 2019-07-26: qty 1

## 2019-07-26 NOTE — Progress Notes (Signed)
Patient ID: William Livingston, male   DOB: Nov 28, 1980, 38 y.o.   MRN: [id number]    3 Days Post-Op  Subjective: Patient doing well.  Passing lots of flatus.  No BM yet.  Tolerating clear liquids well.  Ambulating well.  Objective: Vital signs in last 24 hours: Temp:  [97.6 F (36.4 C)-98.4 F (36.9 C)] 97.6 F (36.4 C) (10/01 0529) Pulse Rate:  [47-57] 47 (10/01 0529) Resp:  [14-18] 18 (10/01 0840) BP: (99-115)/(55-70) 107/63 (10/01 0529) SpO2:  [90 %-100 %] 95 % (10/01 0840) Last BM Date: 07/19/19  Intake/Output from previous day: 09/30 0701 - 10/01 0700 In: 1320 [P.O.:1320] Out: 3200 [Urine:3200] Intake/Output this shift: No intake/output data recorded.  PE: Heart: brady Lungs: CTAB Abd: soft, appropriately tender, +BS, ND, incisions c/d/i  Lab Results:  Recent Labs    07/25/19 0135 07/26/19 0822  WBC 5.3 4.4  HGB 7.9* 8.1*  HCT 27.1* 27.7*  PLT 425* 446*   BMET Recent Labs    07/24/19 0943 07/25/19 0135  NA 138 137  K 4.1 3.9  CL 102 104  CO2 26 27  GLUCOSE 111* 112*  BUN 11 8  CREATININE 0.88 0.77  CALCIUM 8.4* 8.0*   PT/INR No results for input(s): LABPROT, INR in the last 72 hours. CMP     Component Value Date/Time   NA 137 07/25/2019 0135   K 3.9 07/25/2019 0135   CL 104 07/25/2019 0135   CO2 27 07/25/2019 0135   GLUCOSE 112 (H) 07/25/2019 0135   BUN 8 07/25/2019 0135   CREATININE 0.77 07/25/2019 0135   CALCIUM 8.0 (L) 07/25/2019 0135   PROT 6.0 (L) 07/24/2019 0943   ALBUMIN 2.7 (L) 07/24/2019 0943   AST 23 07/24/2019 0943   ALT 21 07/24/2019 0943   ALKPHOS 54 07/24/2019 0943   BILITOT 0.5 07/24/2019 0943   GFRNONAA >60 07/25/2019 0135   GFRAA >60 07/25/2019 0135   Lipase     Component Value Date/Time   LIPASE 20 07/17/2019 1142       Studies/Results: No results found.  Anti-infectives: Anti-infectives (From admission, onward)   Start     Dose/Rate Route Frequency Ordered Stop   07/23/19 0900  clindamycin (CLEOCIN) IVPB 900  mg     900 mg 100 mL/hr over 30 Minutes Intravenous To ShortStay Surgical 07/23/19 0850 07/23/19 1008   07/23/19 0847  gentamicin (GARAMYCIN) 410 mg in dextrose 5 % 100 mL IVPB  Status:  Discontinued     5 mg/kg  82.1 kg 110.3 mL/hr over 60 Minutes Intravenous 60 min pre-op 07/23/19 0848 07/23/19 1350   07/22/19 1032  clindamycin (CLEOCIN) IVPB 900 mg  Status:  Discontinued     900 mg 100 mL/hr over 30 Minutes Intravenous 60 min pre-op 07/22/19 1033 07/23/19 1358   07/22/19 1032  gentamicin (GARAMYCIN) 410 mg in dextrose 5 % 100 mL IVPB     5 mg/kg  82.1 kg 110.3 mL/hr over 60 Minutes Intravenous 60 min pre-op 07/22/19 1033 07/23/19 1008   07/18/19 0130  piperacillin-tazobactam (ZOSYN) IVPB 3.375 g  Status:  Discontinued     3.375 g 12.5 mL/hr over 240 Minutes Intravenous Every 8 hours 07/17/19 1853 07/20/19 1225   07/17/19 1900  piperacillin-tazobactam (ZOSYN) IVPB 3.375 g     3.375 g 100 mL/hr over 30 Minutes Intravenous  Once 07/17/19 1850 07/17/19 2327       Assessment/Plan Acute blood loss anemia -8.1  Right colon cancer hepatic flexure, gallstones POD 3, S/pLaparoscopic  converted to open partial colectomy and open cholecystectomy9/28 Dr. Brantley Stage - surgical pathology pending -adv to full liquids.  -DC PCA -multimodal pain control -cont mobilization and pulm toilet  ID -zosyn 9/22>>9/25, clinda/gent periop x1 FEN -SLIV, FLD VTE -SCDs, lovenox Foley -d/c  Start flomax  Follow up -Dr. Brantley Stage, oncology   LOS: 9 days    Henreitta Cea , [facility] 07/26/2019, 9:40 AM Pager: [phone]

## 2019-07-26 NOTE — Plan of Care (Signed)
  Problem: Pain Managment: Goal: General experience of comfort will improve Outcome: Progressing   Problem: Safety: Goal: Ability to remain free from injury will improve Outcome: Progressing   Problem: Skin Integrity: Goal: Risk for impaired skin integrity will decrease Outcome: Progressing   

## 2019-07-27 LAB — BASIC METABOLIC PANEL
Anion gap: 10 (ref 5–15)
BUN: <5 mg/dL — ABNORMAL LOW (ref 6–20)
CO2: 28 mmol/L (ref 22–32)
Calcium: 8.7 mg/dL — ABNORMAL LOW (ref 8.9–10.3)
Chloride: 99 mmol/L (ref 98–111)
Creatinine, Ser: 0.76 mg/dL (ref 0.61–1.24)
GFR calc Af Amer: >60 mL/min (ref >60)
GFR calc non Af Amer: >60 mL/min (ref >60)
Glucose, Bld: 136 mg/dL — ABNORMAL HIGH (ref 70–99)
Potassium: 4.1 mmol/L (ref 3.5–5.1)
Sodium: 137 mmol/L (ref 135–145)

## 2019-07-27 MED ORDER — POTASSIUM CHLORIDE 2 MEQ/ML IV SOLN: INTRAVENOUS | Status: DC

## 2019-07-27 MED ORDER — KCL IN DEXTROSE-NACL 20-5-0.9 MEQ/L-%-% IV SOLN
INTRAVENOUS | Status: DC
  Administered 2019-07-27 – 2019-07-30 (×3): via INTRAVENOUS
  Filled 2019-07-27 (×5): qty 1000

## 2019-07-27 NOTE — Progress Notes (Addendum)
Patient ID: William Livingston, male   DOB: [birth year], 38 y.o.   MRN: [id number]    4 Days Post-Op  Subjective: Nauseated, vomiting overnight.  Feels very bloated. Pain is well controlled.  Objective: Vital signs in last 24 hours: Temp:  [97.5 F (36.4 C)-98.3 F (36.8 C)] 98.2 F (36.8 C) (10/02 0539) Pulse Rate:  [55-95] 95 (10/02 0539) Resp:  [17-19] 17 (10/01 2013) BP: (110-130)/(65-85) 130/85 (10/02 0539) SpO2:  [92 %-99 %] 92 % (10/02 0539) Weight:  [85.7 kg] 85.7 kg (10/02 0539) Last BM Date: 07/19/19  Intake/Output from previous day: 10/01 0701 - 10/02 0700 In: 1560 [P.O.:1560] Out: -  Intake/Output this shift: No intake/output data recorded.  PE: Heart: regular, brady Lungs: CTAB Abd: soft, but more distended today, less BS, but appropriately tender, incisions are c/d/i with gauze and tegaderm in place.  Lab Results:  Recent Labs    07/25/19 0135 07/26/19 0822  WBC 5.3 4.4  HGB 7.9* 8.1*  HCT 27.1* 27.7*  PLT 425* 446*   BMET Recent Labs    07/24/19 0943 07/25/19 0135  NA 138 137  K 4.1 3.9  CL 102 104  CO2 26 27  GLUCOSE 111* 112*  BUN 11 8  CREATININE 0.88 0.77  CALCIUM 8.4* 8.0*   PT/INR No results for input(s): LABPROT, INR in the last 72 hours. CMP     Component Value Date/Time   NA 137 07/25/2019 0135   K 3.9 07/25/2019 0135   CL 104 07/25/2019 0135   CO2 27 07/25/2019 0135   GLUCOSE 112 (H) 07/25/2019 0135   BUN 8 07/25/2019 0135   CREATININE 0.77 07/25/2019 0135   CALCIUM 8.0 (L) 07/25/2019 0135   PROT 6.0 (L) 07/24/2019 0943   ALBUMIN 2.7 (L) 07/24/2019 0943   AST 23 07/24/2019 0943   ALT 21 07/24/2019 0943   ALKPHOS 54 07/24/2019 0943   BILITOT 0.5 07/24/2019 0943   GFRNONAA >60 07/25/2019 0135   GFRAA >60 07/25/2019 0135   Lipase     Component Value Date/Time   LIPASE 20 07/17/2019 1142       Studies/Results: No results found.  Anti-infectives: Anti-infectives (From admission, onward)   Start     Dose/Rate  Route Frequency Ordered Stop   07/23/19 0900  clindamycin (CLEOCIN) IVPB 900 mg     900 mg 100 mL/hr over 30 Minutes Intravenous To ShortStay Surgical 07/23/19 0850 07/23/19 1008   07/23/19 0847  gentamicin (GARAMYCIN) 410 mg in dextrose 5 % 100 mL IVPB  Status:  Discontinued     5 mg/kg  82.1 kg 110.3 mL/hr over 60 Minutes Intravenous 60 min pre-op 07/23/19 0848 07/23/19 1350   07/22/19 1032  clindamycin (CLEOCIN) IVPB 900 mg  Status:  Discontinued     900 mg 100 mL/hr over 30 Minutes Intravenous 60 min pre-op 07/22/19 1033 07/23/19 1358   07/22/19 1032  gentamicin (GARAMYCIN) 410 mg in dextrose 5 % 100 mL IVPB     5 mg/kg  82.1 kg 110.3 mL/hr over 60 Minutes Intravenous 60 min pre-op 07/22/19 1033 07/23/19 1008   07/18/19 0130  piperacillin-tazobactam (ZOSYN) IVPB 3.375 g  Status:  Discontinued     3.375 g 12.5 mL/hr over 240 Minutes Intravenous Every 8 hours 07/17/19 1853 07/20/19 1225   07/17/19 1900  piperacillin-tazobactam (ZOSYN) IVPB 3.375 g     3.375 g 100 mL/hr over 30 Minutes Intravenous  Once 07/17/19 1850 07/17/19 2327       Assessment/Plan Acute blood loss  anemia -8.1  Right colon cancer hepatic flexure, gallstones POD 4, S/pLaparoscopic converted to open partial colectomy and open cholecystectomy9/28 Dr. Brantley Stage - surgical pathology pending -more bloated today with 1 episode of emesis.  Mild ileus, will decrease back to NPO with few sips.  Await return of bowel function again. -multimodal pain control -cont mobilization and pulm toilet -check labs today to assure K is ok and everything else looks good  ID -zosyn 9/22>>9/25, clinda/gent periop x1 FEN -NPO, few sips, IVFs 50cc/hr VTE -SCDs, lovenox Foley -d/c Start flomax  Follow up -Dr. Brantley Stage, oncology   LOS: 10 days    Henreitta Cea , [facility] 07/27/2019, 8:25 AM Pager: [phone]

## 2019-07-28 ENCOUNTER — Inpatient Hospital Stay (HOSPITAL_COMMUNITY): Payer: Managed Care, Other (non HMO)

## 2019-07-28 LAB — CBC
HCT: 33.9 % — ABNORMAL LOW (ref 39.0–52.0)
Hemoglobin: 10.2 g/dL — ABNORMAL LOW (ref 13.0–17.0)
MCH: 19.7 pg — ABNORMAL LOW (ref 26.0–34.0)
MCHC: 30.1 g/dL (ref 30.0–36.0)
MCV: 65.3 fL — ABNORMAL LOW (ref 80.0–100.0)
Platelets: 694 10*3/uL — ABNORMAL HIGH (ref 150–400)
RBC: 5.19 MIL/uL (ref 4.22–5.81)
RDW: 24.8 % — ABNORMAL HIGH (ref 11.5–15.5)
WBC: 7.8 10*3/uL (ref 4.0–10.5)
nRBC: 0 % (ref 0.0–0.2)

## 2019-07-29 LAB — CBC
HCT: 31.7 % — ABNORMAL LOW (ref 39.0–52.0)
Hemoglobin: 9.6 g/dL — ABNORMAL LOW (ref 13.0–17.0)
MCH: 19.7 pg — ABNORMAL LOW (ref 26.0–34.0)
MCHC: 30.3 g/dL (ref 30.0–36.0)
MCV: 65 fL — ABNORMAL LOW (ref 80.0–100.0)
Platelets: 711 10*3/uL — ABNORMAL HIGH (ref 150–400)
RBC: 4.88 MIL/uL (ref 4.22–5.81)
RDW: 24.8 % — ABNORMAL HIGH (ref 11.5–15.5)
WBC: 7 10*3/uL (ref 4.0–10.5)
nRBC: 0 % (ref 0.0–0.2)

## 2019-07-29 LAB — COMPREHENSIVE METABOLIC PANEL
ALT: 50 U/L — ABNORMAL HIGH (ref 0–44)
AST: 59 U/L — ABNORMAL HIGH (ref 15–41)
Albumin: 2.8 g/dL — ABNORMAL LOW (ref 3.5–5.0)
Alkaline Phosphatase: 79 U/L (ref 38–126)
Anion gap: 10 (ref 5–15)
BUN: 9 mg/dL (ref 6–20)
CO2: 26 mmol/L (ref 22–32)
Calcium: 8.7 mg/dL — ABNORMAL LOW (ref 8.9–10.3)
Chloride: 101 mmol/L (ref 98–111)
Creatinine, Ser: 0.82 mg/dL (ref 0.61–1.24)
GFR calc Af Amer: >60 mL/min (ref >60)
GFR calc non Af Amer: >60 mL/min (ref >60)
Glucose, Bld: 122 mg/dL — ABNORMAL HIGH (ref 70–99)
Potassium: 4 mmol/L (ref 3.5–5.1)
Sodium: 137 mmol/L (ref 135–145)
Total Bilirubin: 0.3 mg/dL (ref 0.3–1.2)
Total Protein: 6.4 g/dL — ABNORMAL LOW (ref 6.5–8.1)

## 2019-07-29 MED ORDER — PROMETHAZINE HCL 25 MG/ML IJ SOLN
12.5000 mg | Freq: Four times a day (QID) | INTRAMUSCULAR | Status: DC | PRN
  Administered 2019-07-29: 12.5 mg via INTRAVENOUS
  Filled 2019-07-29 (×2): qty 1

## 2019-07-29 NOTE — Progress Notes (Signed)
Patient ID: William Livingston, male   DOB: [birth year], 38 y.o.   MRN: [id number] [facility] Surgery Progress Note:   6 Days Post-Op  Subjective: Mental status is alert;  Stressed;  Wife on phone and we had a three way conversation after I had reviewed the chart.  The path report is still pending but we discussed this obstruction as likely cancer.  I tried to encourage him.  Objective: Vital signs in last 24 hours: Temp:  [98.2 F (36.8 C)-98.6 F (37 C)] 98.3 F (36.8 C) (10/04 0601) Pulse Rate:  [67-87] 78 (10/04 0601) Resp:  [17-18] 18 (10/04 0601) BP: (117-123)/(75-78) 122/75 (10/04 0601) SpO2:  [95 %-98 %] 95 % (10/04 0601)  Intake/Output from previous day: 10/03 0701 - 10/04 0700 In: 240 [P.O.:240] Out: 701 [Urine:300; Emesis/NG output:400; Stool:1] Intake/Output this shift: No intake/output data recorded.  Physical Exam: Work of breathing is normal.  He has been walking.  He has vomited at night and he feels like he is not making progress.  Lab Results:  Results for orders placed or performed during the hospital encounter of 07/17/19 (from the past 48 hour(s))  CBC     Status: Abnormal   Collection Time: 07/28/19 11:23 AM  Result Value Ref Range   WBC 7.8 4.0 - 10.5 K/uL   RBC 5.19 4.22 - 5.81 MIL/uL   Hemoglobin 10.2 (L) 13.0 - 17.0 g/dL   HCT 33.9 (L) 39.0 - 52.0 %   MCV 65.3 (L) 80.0 - 100.0 fL   MCH 19.7 (L) 26.0 - 34.0 pg   MCHC 30.1 30.0 - 36.0 g/dL   RDW 24.8 (H) 11.5 - 15.5 %   Platelets 694 (H) 150 - 400 K/uL    Comment: REPEATED TO VERIFY   nRBC 0.0 0.0 - 0.2 %    Comment: Performed at [facility], [place] [address], [place], [place] [address]  Comprehensive metabolic panel     Status: Abnormal   Collection Time: 07/29/19  1:26 AM  Result Value Ref Range   Sodium 137 135 - 145 mmol/L   Potassium 4.0 3.5 - 5.1 mmol/L   Chloride 101 98 - 111 mmol/L   CO2 26 22 - 32 mmol/L   Glucose, Bld 122 (H) 70 - 99 mg/dL   BUN 9 6 - 20 mg/dL   Creatinine, Ser  0.82 0.61 - 1.24 mg/dL   Calcium 8.7 (L) 8.9 - 10.3 mg/dL   Total Protein 6.4 (L) 6.5 - 8.1 g/dL   Albumin 2.8 (L) 3.5 - 5.0 g/dL   AST 59 (H) 15 - 41 U/L   ALT 50 (H) 0 - 44 U/L   Alkaline Phosphatase 79 38 - 126 U/L   Total Bilirubin 0.3 0.3 - 1.2 mg/dL   GFR calc non Af Amer >60 >60 mL/min   GFR calc Af Amer >60 >60 mL/min   Anion gap 10 5 - 15    Comment: Performed at [facility], [place] [address], [place], [place] [address]    Radiology/Results: Dg Abd 1 View  Result Date: 07/28/2019 CLINICAL DATA:  Abdominal distension. No bowel movement since a colectomy on 07/23/2019. Vomiting. EXAM: ABDOMEN - 1 VIEW COMPARISON:  Abdomen and pelvis CT dated 07/17/2019. FINDINGS: Varying degrees of small bowel dilatation in the mid upper abdomen. Mild stool and gas in the rectum. Otherwise, no: Gas is visualized. Skin clips. Cholecystectomy clips. IMPRESSION: Postoperative ileus or small bowel obstruction. Electronically Signed   By: Claudie Revering  M.D.   On: 07/28/2019 13:27    Anti-infectives: Anti-infectives (From admission, onward)   Start     Dose/Rate Route Frequency Ordered Stop   07/23/19 0900  clindamycin (CLEOCIN) IVPB 900 mg     900 mg 100 mL/hr over 30 Minutes Intravenous To ShortStay Surgical 07/23/19 0850 07/23/19 1008   07/23/19 0847  gentamicin (GARAMYCIN) 410 mg in dextrose 5 % 100 mL IVPB  Status:  Discontinued     5 mg/kg  82.1 kg 110.3 mL/hr over 60 Minutes Intravenous 60 min pre-op 07/23/19 0848 07/23/19 1350   07/22/19 1032  clindamycin (CLEOCIN) IVPB 900 mg  Status:  Discontinued     900 mg 100 mL/hr over 30 Minutes Intravenous 60 min pre-op 07/22/19 1033 07/23/19 1358   07/22/19 1032  gentamicin (GARAMYCIN) 410 mg in dextrose 5 % 100 mL IVPB     5 mg/kg  82.1 kg 110.3 mL/hr over 60 Minutes Intravenous 60 min pre-op 07/22/19 1033 07/23/19 1008   07/18/19 0130  piperacillin-tazobactam (ZOSYN) IVPB 3.375 g  Status:  Discontinued     3.375 g 12.5 mL/hr over 240  Minutes Intravenous Every 8 hours 07/17/19 1853 07/20/19 1225   07/17/19 1900  piperacillin-tazobactam (ZOSYN) IVPB 3.375 g     3.375 g 100 mL/hr over 30 Minutes Intravenous  Once 07/17/19 1850 07/17/19 2327      Assessment/Plan: Problem List: Patient Active Problem List   Diagnosis Date Noted  . Mass of colon 07/17/2019    I ordered phenergan for nausea.  Labs OK.  I told him that his ileus is likely resolving.   6 Days Post-Op    LOS: 12 days   Matt B. Hassell Done, MD, [facility] [phone] beeper [phone]  07/29/2019 10:13 AM

## 2019-07-29 NOTE — Progress Notes (Signed)
VAST RN called into room from hallway d/t pt vomiting "a lot". Pt sitting on side of bed with emesis bag vomiting. Called for unit nurse, Tess. Exchanged emesis bags and measured emesis in bag (1200 mLs, dark green liquid). Notified Tess and visually showed emesis bag to her before discarding. Questioned pt if any discussion about a tube being placed into his stomach; he stated they had been trying to avoid that while his colon "wakes up".

## 2019-07-30 MED ORDER — ACETAMINOPHEN 325 MG PO TABS: 650.0000 mg | ORAL_TABLET | Freq: Four times a day (QID) | ORAL | Status: DC | PRN

## 2019-07-30 NOTE — Progress Notes (Signed)
7 Days Post-Op   Subjective/Chief Complaint: Pt states he has no complaints today. He states he had episodes of nausea and vomiting around 10AM yesterday, but has not had any nausea or vomiting since then. He had a BM today and yesterday, and states it looks like green bile. Pt is passing flatus and urinating regularly. Pt states he has not had solid foods, and is hoping to advance his diet soon because he feels hungry.  Objective: Vital signs in last 24 hours: Temp:  [97.8 F (36.6 C)-98.1 F (36.7 C)] 98 F (36.7 C) (10/05 0442) Pulse Rate:  [49-66] 49 (10/05 0442) Resp:  [16-18] 16 (10/05 0442) BP: (110-126)/(75-79) 110/75 (10/05 0442) SpO2:  [97 %-98 %] 97 % (10/05 0442) Last BM Date: 07/29/19  Intake/Output from previous day: 10/04 0701 - 10/05 0700 In: 1428.7 [P.O.:660; I.V.:768.7] Out: 1200 [Emesis/NG output:1200] Intake/Output this shift: No intake/output data recorded.  General: Pleasant man in no acute distress.  CV: Regular rate and rhythm  Pulm: clear to auscultation  Abdominal: Abdomen is soft, with a midline incision, and other small incisions that are covered. No signs of infection. Normoactive BS. Non tender to palpation in all quadrants.   Lab Results:  Recent Labs    07/28/19 1123 07/29/19 1209  WBC 7.8 7.0  HGB 10.2* 9.6*  HCT 33.9* 31.7*  PLT 694* 711*   BMET Recent Labs    07/27/19 1002 07/29/19 0126  NA 137 137  K 4.1 4.0  CL 99 101  CO2 28 26  GLUCOSE 136* 122*  BUN <5* 9  CREATININE 0.76 0.82  CALCIUM 8.7* 8.7*   PT/INR No results for input(s): LABPROT, INR in the last 72 hours. ABG No results for input(s): PHART, HCO3 in the last 72 hours.  Invalid input(s): PCO2, PO2  Studies/Results: Dg Abd 1 View  Result Date: 07/28/2019 CLINICAL DATA:  Abdominal distension. No bowel movement since a colectomy on 07/23/2019. Vomiting. EXAM: ABDOMEN - 1 VIEW COMPARISON:  Abdomen and pelvis CT dated 07/17/2019. FINDINGS: Varying degrees of small  bowel dilatation in the mid upper abdomen. Mild stool and gas in the rectum. Otherwise, no: Gas is visualized. Skin clips. Cholecystectomy clips. IMPRESSION: Postoperative ileus or small bowel obstruction. Electronically Signed   By: Claudie Revering M.D.   On: 07/28/2019 13:27    Anti-infectives: Anti-infectives (From admission, onward)   Start     Dose/Rate Route Frequency Ordered Stop   07/23/19 0900  clindamycin (CLEOCIN) IVPB 900 mg     900 mg 100 mL/hr over 30 Minutes Intravenous To ShortStay Surgical 07/23/19 0850 07/23/19 1008   07/23/19 0847  gentamicin (GARAMYCIN) 410 mg in dextrose 5 % 100 mL IVPB  Status:  Discontinued     5 mg/kg  82.1 kg 110.3 mL/hr over 60 Minutes Intravenous 60 min pre-op 07/23/19 0848 07/23/19 1350   07/22/19 1032  clindamycin (CLEOCIN) IVPB 900 mg  Status:  Discontinued     900 mg 100 mL/hr over 30 Minutes Intravenous 60 min pre-op 07/22/19 1033 07/23/19 1358   07/22/19 1032  gentamicin (GARAMYCIN) 410 mg in dextrose 5 % 100 mL IVPB     5 mg/kg  82.1 kg 110.3 mL/hr over 60 Minutes Intravenous 60 min pre-op 07/22/19 1033 07/23/19 1008   07/18/19 0130  piperacillin-tazobactam (ZOSYN) IVPB 3.375 g  Status:  Discontinued     3.375 g 12.5 mL/hr over 240 Minutes Intravenous Every 8 hours 07/17/19 1853 07/20/19 1225   07/17/19 1900  piperacillin-tazobactam (ZOSYN) IVPB 3.375  g     3.375 g 100 mL/hr over 30 Minutes Intravenous  Once 07/17/19 1850 07/17/19 2327      Assessment/Plan: POD7 for Open right colectomy and cholecystectomy  ID: clindamycin FEN: liquids, tolerating  DVT: n/a Follow up: TBD   LOS: 13 days    Renato Gails Chelsei Mcchesney 07/30/2019

## 2019-07-31 LAB — CBC
HCT: 32.5 % — ABNORMAL LOW (ref 39.0–52.0)
Hemoglobin: 9.4 g/dL — ABNORMAL LOW (ref 13.0–17.0)
MCH: 19 pg — ABNORMAL LOW (ref 26.0–34.0)
MCHC: 28.9 g/dL — ABNORMAL LOW (ref 30.0–36.0)
MCV: 65.7 fL — ABNORMAL LOW (ref 80.0–100.0)
Platelets: 730 10*3/uL — ABNORMAL HIGH (ref 150–400)
RBC: 4.95 MIL/uL (ref 4.22–5.81)
RDW: 24.2 % — ABNORMAL HIGH (ref 11.5–15.5)
WBC: 8.7 10*3/uL (ref 4.0–10.5)
nRBC: 0 % (ref 0.0–0.2)

## 2019-07-31 LAB — CREATININE, SERUM
Creatinine, Ser: 0.8 mg/dL (ref 0.61–1.24)
GFR calc Af Amer: >60 mL/min (ref >60)
GFR calc non Af Amer: >60 mL/min (ref >60)

## 2019-07-31 MED ORDER — OXYCODONE HCL 5 MG PO TABS: 5.0000 mg | ORAL_TABLET | Freq: Four times a day (QID) | ORAL | 0 refills | Status: DC | PRN

## 2019-07-31 NOTE — Progress Notes (Signed)
[facility]/Trauma Progress Note  8 Days Post-Op   Assessment/Plan Acute blood loss anemia - stable  Right colon cancer hepatic flexure, gallstones POD 4,S/pLaparoscopic converted to open partial colectomy and open cholecystectomy9/28 Dr. Brantley Stage - surgical pathology pending, will call path today as this should have been back by now - cont mobilization and pulm toilet Post op Ileus - Pt had a BM yesterday, also an episode of emesis at 1600. Nausea today but no vomiting.   ID -zosyn 9/22>>9/25, clinda/gent periop x1 FEN -soft diet VTE -SCDs, lovenox Foley -none  Follow up -Dr. Brantley Stage, oncology  Plan: post op ileus is resolving but with episode of emesis yesterday afternoon will not discharge until tomorrow if pt does not have any N or V overnight. Am labs    LOS: 14 days    Subjective: CC: nausea and vomiting  Pt vomited yesterday around 1600 about 650cc. Nausea this am but no vomiting. He is having flatus and small loose BM's. No fever or chills.   Objective: Vital signs in last 24 hours: Temp:  [97.9 F (36.6 C)-98.3 F (36.8 C)] 98.3 F (36.8 C) (10/06 0601) Pulse Rate:  [57-62] 62 (10/06 0601) Resp:  [18] 18 (10/06 0601) BP: (112-128)/(65-79) 115/65 (10/06 0601) SpO2:  [95 %-98 %] 96 % (10/06 0601) Last BM Date: 07/30/19  Intake/Output from previous day: 10/05 0701 - 10/06 0700 In: -  Out: 650 [Emesis/NG output:650] Intake/Output this shift: No intake/output data recorded.  PE: Gen:  Alert, NAD, pleasant, cooperative Card:  RRR, no M/G/R heard Pulm:  CTA, no W/R/R, rate and effort normal Abd: Soft, NT/ND, +BS, incisions with staples intact are without signs of infection. No peritonitis  Skin: no rashes noted, warm and dry   Anti-infectives: Anti-infectives (From admission, onward)   Start     Dose/Rate Route Frequency Ordered Stop   07/23/19 0900  clindamycin (CLEOCIN) IVPB 900 mg     900 mg 100 mL/hr over 30 Minutes  Intravenous To ShortStay Surgical 07/23/19 0850 07/23/19 1008   07/23/19 0847  gentamicin (GARAMYCIN) 410 mg in dextrose 5 % 100 mL IVPB  Status:  Discontinued     5 mg/kg  82.1 kg 110.3 mL/hr over 60 Minutes Intravenous 60 min pre-op 07/23/19 0848 07/23/19 1350   07/22/19 1032  clindamycin (CLEOCIN) IVPB 900 mg  Status:  Discontinued     900 mg 100 mL/hr over 30 Minutes Intravenous 60 min pre-op 07/22/19 1033 07/23/19 1358   07/22/19 1032  gentamicin (GARAMYCIN) 410 mg in dextrose 5 % 100 mL IVPB     5 mg/kg  82.1 kg 110.3 mL/hr over 60 Minutes Intravenous 60 min pre-op 07/22/19 1033 07/23/19 1008   07/18/19 0130  piperacillin-tazobactam (ZOSYN) IVPB 3.375 g  Status:  Discontinued     3.375 g 12.5 mL/hr over 240 Minutes Intravenous Every 8 hours 07/17/19 1853 07/20/19 1225   07/17/19 1900  piperacillin-tazobactam (ZOSYN) IVPB 3.375 g     3.375 g 100 mL/hr over 30 Minutes Intravenous  Once 07/17/19 1850 07/17/19 2327      Lab Results:  Recent Labs    07/29/19 1209 07/31/19 0535  WBC 7.0 8.7  HGB 9.6* 9.4*  HCT 31.7* 32.5*  PLT 711* 730*   BMET Recent Labs    07/29/19 0126 07/31/19 0535  NA 137  --   K 4.0  --   CL 101  --   CO2 26  --   GLUCOSE 122*  --   BUN 9  --  CREATININE 0.82 0.80  CALCIUM 8.7*  --    PT/INR No results for input(s): LABPROT, INR in the last 72 hours. CMP     Component Value Date/Time   NA 137 07/29/2019 0126   K 4.0 07/29/2019 0126   CL 101 07/29/2019 0126   CO2 26 07/29/2019 0126   GLUCOSE 122 (H) 07/29/2019 0126   BUN 9 07/29/2019 0126   CREATININE 0.80 07/31/2019 0535   CALCIUM 8.7 (L) 07/29/2019 0126   PROT 6.4 (L) 07/29/2019 0126   ALBUMIN 2.8 (L) 07/29/2019 0126   AST 59 (H) 07/29/2019 0126   ALT 50 (H) 07/29/2019 0126   ALKPHOS 79 07/29/2019 0126   BILITOT 0.3 07/29/2019 0126   GFRNONAA >60 07/31/2019 0535   GFRAA >60 07/31/2019 0535   Lipase     Component Value Date/Time   LIPASE 20 07/17/2019 1142     Studies/Results: No results found.   Kalman Drape, [facility] Surgery Pager [phone] Cristine Polio, & Friday 7:00am - 4:30pm Thursdays 7:00am -11:30am  Consults: [phone]

## 2019-07-31 NOTE — Progress Notes (Signed)
Patient discharged to home with instructions. 

## 2019-07-31 NOTE — Discharge Summary (Signed)
[facility]/Trauma Discharge Summary   Patient ID: William Livingston MRN: [id number] DOB/AGE: [birth year] 38 y.o.  Admit date: 07/17/2019 Discharge date: 07/31/2019  Admitting Diagnosis: Right colon mass Anemia  Discharge Diagnosis Patient Active Problem List   Diagnosis Date Noted  . Mass of colon 07/17/2019    Consultants Gastroenterology, Dr. Therisa Doyne  Imaging: No results found.  Procedures Dr. Alessandra Bevels (07/19/19) -colonoscopy Dr. Brantley Stage (07/23/19) - laparoscopic converted to open partial colectomy and open cholecystectomy  HPI: William Livingston is an 38 y.o. male.   Chief Complaint: abd pain , colon mass HPI: 38 yom with no pmh who has a couple years of intermittent abdominal pain that has progressively gotten more constant over past six months.  He has recently seen gi physician and found to have hb of 7.6 and presented to er today.  He has fatigue, not able to ride bike as well.  He is eating with nl appetite.  He is having nl bms and passing flatus.  He does feel dizzy with standing.  He denies fevers. No prior surgery. Had a ct scan that shows a large mass in right colon and I was called  Hospital Course:  Workup showed right colon mass and anemia.  Patient was admitted and underwent blood transfusion on 9/24 secondary to acute blood loss anemia.  Patient underwent colonoscopy on 9/24 which showed large mass extending from hepatic flexure all the way up to proximal ascending colon with significant stenosis at proximal ascending colon.  Not able to advance scope up to the cecum.  Some benign polyps removed and biopsies of the mass showed adenocarcinoma.  No evidence of metastatic disease was noted on CT chest or abdomen.  CEA was 1.7.  Patient underwent procedure listed above with Dr. Brantley Stage on 9/28.  Hospital course complicated by mild postop ileus.  This eventually resolved.  Diet was advanced as tolerated.  Surgical pathology was discussed with patient and his wife.   Discussed discharge instructions.  I spoke with Dr. Benay Spice, oncology, who will set up a follow-up appointment for the patient.  On 07/31/2019, the patient was voiding well, tolerating diet, ambulating well, pain well controlled, vital signs stable, incisions c/d/i and felt stable for discharge home.  Patient will follow up as outlined below and knows to call with questions or concerns.     Patient was discharged in good condition.  The [place] Substance controlled database was reviewed prior to prescribing narcotic pain medication to this patient.  Physical Exam: Gen:  Alert, NAD, pleasant, cooperative Card:  RRR, no M/G/R heard Pulm:  CTA, no W/R/R, rate and effort normal Abd: Soft, NT/ND, +BS, incisions with staples intact are without signs of infection. No peritonitis  Skin: no rashes noted, warm and dry  Allergies as of 07/31/2019      Reactions   Amoxicillin       Medication List    TAKE these medications   acetaminophen 325 MG tablet Commonly known as: TYLENOL Take 2 tablets (650 mg total) by mouth every 6 (six) hours as needed for mild pain (or temp > 100).   magnesium 30 MG tablet Take 60 mg by mouth as needed (Hard workout).   Melatonin 10 MG Tabs Take 10 mg by mouth at bedtime as needed.   oxyCODONE 5 MG immediate release tablet Commonly known as: Oxy IR/ROXICODONE Take 1 tablet (5 mg total) by mouth every 6 (six) hours as needed for moderate pain.  Follow-up Information    Ladell Pier, MD. Call.   Specialty: Oncology Why: if you do not hear from them about a follow up appointment  Contact information: [place] [address] [phone]        Surgery, [place] Follow up on 08/03/2019.   Specialty: General Surgery Why: you will see the nursing staff and have your staples removed.  Your appointment is at [place].  Be at the office 30 minutes early for check in.  Bring photo ID and insurance information with  you.   Contact information: [address] [place] [address] [phone]        Erroll Luna, MD Follow up on 08/13/2019.   Specialty: General Surgery Why: Your appointment is at 2:10 PM.  Be at the office 30 minutes early for check in.  Bring photo ID and insurance information with you.   Contact information: [address] [place] [address] [phone]           Signed: [facility] 07/31/2019, 4:09 PM Pager: [phone] Consults: [phone] Mon-Fri 7:00 am-4:30 pm Sat-Sun 7:00 am-11:30 am

## 2019-08-01 ENCOUNTER — Telehealth: Payer: Self-pay | Admitting: Oncology

## 2019-08-01 NOTE — Telephone Encounter (Signed)
Received a msg to schedule an appt for Mr. William Livingston to see Dr. Benay Livingston on 10/15 at 2pm. Pt's wife cld and has been made aware of the appt date and time. I informed her of our no visitor policy and that Mr. William Livingston should arrive 15 minutes early.

## 2019-08-09 ENCOUNTER — Encounter: Payer: Self-pay | Admitting: *Deleted

## 2019-08-09 ENCOUNTER — Other Ambulatory Visit: Payer: Self-pay

## 2019-08-09 ENCOUNTER — Inpatient Hospital Stay: Payer: Managed Care, Other (non HMO) | Attending: Oncology | Admitting: Oncology

## 2019-08-09 ENCOUNTER — Telehealth: Payer: Self-pay | Admitting: Oncology

## 2019-08-09 VITALS — BP 129/82 | HR 95 | Temp 98.7°F | Resp 17 | Ht 71.0 in | Wt 177.0 lb

## 2019-08-09 DIAGNOSIS — Z5111 Encounter for antineoplastic chemotherapy: Secondary | ICD-10-CM | POA: Insufficient documentation

## 2019-08-09 DIAGNOSIS — Z23 Encounter for immunization: Secondary | ICD-10-CM | POA: Diagnosis not present

## 2019-08-09 DIAGNOSIS — C182 Malignant neoplasm of ascending colon: Secondary | ICD-10-CM | POA: Insufficient documentation

## 2019-08-09 DIAGNOSIS — Z803 Family history of malignant neoplasm of breast: Secondary | ICD-10-CM | POA: Diagnosis not present

## 2019-08-09 DIAGNOSIS — Z8042 Family history of malignant neoplasm of prostate: Secondary | ICD-10-CM | POA: Diagnosis not present

## 2019-08-09 DIAGNOSIS — D630 Anemia in neoplastic disease: Secondary | ICD-10-CM | POA: Insufficient documentation

## 2019-08-09 MED ORDER — INFLUENZA VAC SPLIT QUAD 0.5 ML IM SUSY
0.5000 mL | PREFILLED_SYRINGE | Freq: Once | INTRAMUSCULAR | Status: AC
  Administered 2019-08-09: 16:00:00 0.5 mL via INTRAMUSCULAR

## 2019-08-09 MED ORDER — INFLUENZA VAC SPLIT QUAD 0.5 ML IM SUSY
PREFILLED_SYRINGE | INTRAMUSCULAR | Status: AC
  Filled 2019-08-09: qty 0.5

## 2019-08-09 NOTE — Progress Notes (Signed)
START ON PATHWAY REGIMEN - Colorectal     A cycle is every 21 days:     Capecitabine      Oxaliplatin   **Always confirm dose/schedule in your pharmacy ordering system**  Patient Characteristics: Postoperative without Neoadjuvant Therapy (Pathologic Staging), Colon, Stage IIB/C Tumor Location: Colon Therapeutic Status: Postoperative without Neoadjuvant Therapy (Pathologic Staging) AJCC M Category: Staged < 8th Ed. AJCC T Category: Staged < 8th Ed. AJCC N Category: Staged < 8th Ed. AJCC 8 Stage Grouping: Staged < 8th Ed. Intent of Therapy: Curative Intent, Discussed with Patient

## 2019-08-09 NOTE — Telephone Encounter (Signed)
Scheduled appt per 10/15 los- pt aware of appt date and times - my chart active - per pt no print out needed

## 2019-08-09 NOTE — Progress Notes (Signed)
Per Dr. Benay Spice: Requested MSI and IHC testing on case #[id number] dated 07/23/2019. Requested from [facility] Pathology Department via Email.

## 2019-08-09 NOTE — Progress Notes (Signed)
Reports he and his wife are attempting to get pregnant again. States earlier vasectomy has been reversed. Dr. Benay Spice notified.

## 2019-08-09 NOTE — Progress Notes (Signed)
Talked to pt prior to initial consult today. Was able to speak with his wife as well.  Provided contact information for treatment team members.  Patient has my direct contact information for questions or concerns. Will f/u with patient to answer any questions about today's appointment. Will follow as needed.
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
## 2019-08-10 ENCOUNTER — Encounter: Payer: Self-pay | Admitting: *Deleted

## 2019-08-10 ENCOUNTER — Telehealth: Payer: Self-pay | Admitting: *Deleted

## 2019-08-10 NOTE — Telephone Encounter (Addendum)
MD has ordered referral to sperm bank since he and his wife would like to get pregnant again. Left VM for patient to call back to discuss and sent MyChart message as well with two sperm banks in [place] asking which he prefers? Suggested he inquire if the fertility specialist his wife sees provides this service or prefers one over the other. Patient called back and wife's OBGYN suggested [facility] for him. Patient provided phone [phone] and fax [phone]. OB did not send referral because they are closed today. Infomred him we will fax over his referral with records.

## 2019-08-14 ENCOUNTER — Telehealth: Payer: Self-pay | Admitting: Pharmacist

## 2019-08-14 DIAGNOSIS — C182 Malignant neoplasm of ascending colon: Secondary | ICD-10-CM

## 2019-08-14 MED ORDER — CAPECITABINE 500 MG PO TABS: ORAL_TABLET | ORAL | 0 refills | Status: DC

## 2019-08-14 NOTE — Telephone Encounter (Signed)
Oral Oncology Pharmacist Encounter  Received new prescription for Xeloda (capecitabine) for the adjuvant treatment of stage IIb colon cancer with high risk features in conjunction with oxaliplatin, planned duration 3-6 months. Planned start date is 08/23/19  Original diagnosis in Sept 2020 Resection was performed on 07/23/2019 Patient is under evaluation to initiate adjuvant treatment with capecitabine at ~870 mg/m2 (2000 mg in AM & 1500 mg in PM) for 14 days on, 7 days off, repeated every 21 days Oxaliplatin is planned to be administered at ~135 mg/m2 IV once every 3 weeks  Labs from Epic assessed, OK for treatment iniitiation.  Current medication list in Epic reviewed, no significant DDIs with capecitabine identified.  Prescription has been e-scribed to the [facility] for benefits analysis and approval.  Oral Oncology Clinic will continue to follow for insurance authorization, copayment issues, initial counseling and start date.  Johny Drilling, PharmD, BCPS, BCOP  08/14/2019 2:10 PM Oral Oncology Clinic [phone]

## 2019-08-15 ENCOUNTER — Telehealth: Payer: Self-pay | Admitting: *Deleted

## 2019-08-15 ENCOUNTER — Telehealth: Payer: Self-pay

## 2019-08-15 NOTE — Telephone Encounter (Signed)
Left VM asking if he has received call yet from the [facility] regarding the referral we sent on Friday. Call back if he needs assistance getting in.

## 2019-08-15 NOTE — Telephone Encounter (Signed)
Oral Oncology Patient Advocate Encounter  Received notification from Christella Scheuermann that prior authorization for Xeloda is required.  PA submitted by phone to [facility] [phone] Case # [id number] Status is pending  [facility] will continue to follow.  [place] Patient [place] Phone [phone] Fax [phone] 08/15/2019    1:41 PM

## 2019-08-16 LAB — SURGICAL PATHOLOGY

## 2019-08-17 ENCOUNTER — Inpatient Hospital Stay: Payer: Managed Care, Other (non HMO)

## 2019-08-17 ENCOUNTER — Other Ambulatory Visit: Payer: Self-pay

## 2019-08-17 DIAGNOSIS — C182 Malignant neoplasm of ascending colon: Secondary | ICD-10-CM

## 2019-08-17 DIAGNOSIS — Z5111 Encounter for antineoplastic chemotherapy: Secondary | ICD-10-CM | POA: Diagnosis not present

## 2019-08-17 LAB — CMP (CANCER CENTER ONLY)
ALT: 33 U/L (ref 0–44)
AST: 23 U/L (ref 15–41)
Albumin: 4.1 g/dL (ref 3.5–5.0)
Alkaline Phosphatase: 94 U/L (ref 38–126)
Anion gap: 10 (ref 5–15)
BUN: 15 mg/dL (ref 6–20)
CO2: 27 mmol/L (ref 22–32)
Calcium: 9.1 mg/dL (ref 8.9–10.3)
Chloride: 102 mmol/L (ref 98–111)
Creatinine: 0.96 mg/dL (ref 0.61–1.24)
GFR, Est AFR Am: >60 mL/min (ref >60)
GFR, Estimated: >60 mL/min (ref >60)
Glucose, Bld: 113 mg/dL — ABNORMAL HIGH (ref 70–99)
Potassium: 4.2 mmol/L (ref 3.5–5.1)
Sodium: 139 mmol/L (ref 135–145)
Total Bilirubin: 0.4 mg/dL (ref 0.3–1.2)
Total Protein: 7.3 g/dL (ref 6.5–8.1)

## 2019-08-17 LAB — CBC WITH DIFFERENTIAL (CANCER CENTER ONLY)
Abs Immature Granulocytes: 0.02 10*3/uL (ref 0.00–0.07)
Basophils Absolute: 0 10*3/uL (ref 0.0–0.1)
Basophils Relative: 1 %
Eosinophils Absolute: 0.2 10*3/uL (ref 0.0–0.5)
Eosinophils Relative: 5 %
HCT: 35.9 % — ABNORMAL LOW (ref 39.0–52.0)
Hemoglobin: 10.5 g/dL — ABNORMAL LOW (ref 13.0–17.0)
Immature Granulocytes: 0 %
Lymphocytes Relative: 32 %
Lymphs Abs: 1.5 10*3/uL (ref 0.7–4.0)
MCH: 19.4 pg — ABNORMAL LOW (ref 26.0–34.0)
MCHC: 29.2 g/dL — ABNORMAL LOW (ref 30.0–36.0)
MCV: 66.5 fL — ABNORMAL LOW (ref 80.0–100.0)
Monocytes Absolute: 0.7 10*3/uL (ref 0.1–1.0)
Monocytes Relative: 15 %
Neutro Abs: 2.1 10*3/uL (ref 1.7–7.7)
Neutrophils Relative %: 47 %
Platelet Count: 391 10*3/uL (ref 150–400)
RBC: 5.4 MIL/uL (ref 4.22–5.81)
RDW: 25.5 % — ABNORMAL HIGH (ref 11.5–15.5)
WBC Count: 4.6 10*3/uL (ref 4.0–10.5)
nRBC: 0 % (ref 0.0–0.2)

## 2019-08-17 MED ORDER — CAPECITABINE 500 MG PO TABS: ORAL_TABLET | ORAL | 0 refills | Status: DC

## 2019-08-17 NOTE — Telephone Encounter (Signed)
Oral Chemotherapy Pharmacist Encounter   I spoke with patient for overview of: Xeloda (capecitabine) for the adjuvant treatment of stage IIb colon cancer with high risk features in conjunction with oxaliplatin, planned duration 3-6 months.  Counseled patient on administration, dosing, side effects, monitoring, drug-food interactions, safe handling, storage, and disposal.  Patient will take Xeloda 500mg  tablets, 4 tablets (2000mg ) by mouth in AM and 3 tabs (1500mg ) by mouth in PM, within 30 minutes of finishing meals, on days 1-14 of each 21 day cycle.   Oxaliplatin will be infused on day 1 of each 21 day cycle.  Xeloda and oxaliplatin start date: 08/23/19  Adverse effects include but are not limited to: fatigue, decreased blood counts, GI upset, diarrhea, mouth sores, and hand-foot syndrome.  Patient will obtain anti diarrheal and alert the office of 4 or more loose stools above baseline.  Reviewed with patient importance of keeping a medication schedule and plan for any missed doses.  Medication reconciliation performed and medication/allergy list updated.  Insurance authorization for Xeloda has been obtained. Xeloda prescription must be filled at [facility] delivery per insurance requirement. Prescription was e-scribed to [facility] home delivery pharmacy this morning. Patient will reach out to oral oncology clinic on Monday 10/26 if he has not yet received contact from [facility].  We extensively discussed family planning and fertility issues.  All questions answered.  William Livingston  voiced understanding and appreciation.   Patient knows to call the office with questions or concerns.  Johny Drilling, PharmD, BCPS, BCOP 08/17/2019   11:14 AM Oral Oncology Clinic [phone]

## 2019-08-17 NOTE — Telephone Encounter (Signed)
Oral Oncology Patient Advocate Encounter  Prior Authorization for Xeloda has been approved.    PA# [id number] Effective dates: 08/15/19 through 08/16/20  Oral Oncology Clinic will continue to follow.   [place] Patient [place] Phone [phone] Fax [phone] 08/17/2019    10:22 AM

## 2019-08-18 ENCOUNTER — Other Ambulatory Visit: Payer: Self-pay | Admitting: Oncology

## 2019-08-20 MED ORDER — CAPECITABINE 500 MG PO TABS: ORAL_TABLET | ORAL | 0 refills | Status: DC

## 2019-08-20 NOTE — Telephone Encounter (Signed)
Oral Oncology Patient Advocate Encounter  I called [facility] and confirmed that they do have the Xeloda prescription. They have not processed it yet so they do not have copay information. I requested that they expedite the process since the patient would need to start this week, the request has been submitted in [facility] system.  [facility] Patient [place] Phone [phone] Fax [phone] 08/20/2019   12:00 PM

## 2019-08-20 NOTE — Telephone Encounter (Signed)
Oral Oncology Pharmacist Encounter  Received notification that [facility] home delivery pharmacy does not dispense capecitabine any longer and the prescription needs to be sent to [facility].  Prescription has been e-scribed to appropriate pharmacy for dispensing. Patient has been updated on dispensing pharmacy change. We will call [facility] this afternoon to ensure timely processing of prescription.  Johny Drilling, PharmD, BCPS, BCOP  08/20/2019 9:45 AM Oral Oncology Clinic [phone]

## 2019-08-21 NOTE — Telephone Encounter (Signed)
Oral Oncology Pharmacist Encounter  I called [facility] to follow-up on status of Xeloda (capecitabine) new prescription that was E scribed to their pharmacy yesterday, 08/20/2019. Representative stated that they did not have any documentation that we had followed up with them in the afternoon yesterday and put an expedited request on patient's prescription.  I spoke with the pharmacist.  Prescription is in its last stages of processing, they have already performed insurance authorization and noted that the prescription has an approved prior authorization. They stated they would be able to reach out to the patient today to schedule shipment of his first fill of capecitabine. They also stated that patient could call between 1 PM and 2 PM today to schedule his shipment if he did not want to wait for a call.  I called the patient and reviewed above information. Patient was instructed to go on and call [facility] at 1 PM today at [phone] so that he could schedule his shipment to ensure it is delivered to his mailing address tomorrow. Xeloda planned start date: 08/23/2019  All questions answered. Patient expressed understanding and appreciation. Patient instructed to contact oral oncology clinic if he has any issues scheduling his shipment early this afternoon.  Johny Drilling, PharmD, BCPS, BCOP  08/21/2019 11:13 AM Oral Oncology Clinic [phone]

## 2019-08-22 NOTE — Telephone Encounter (Signed)
Oral Oncology Patient Advocate Encounter  Confirmed with [facility] that Xeloda will be delivered to the patient today, 10/28 with a $0 copay.  [place] Patient [place] Phone [phone] Fax [phone] 08/22/2019   8:49 AM

## 2019-08-23 ENCOUNTER — Inpatient Hospital Stay (HOSPITAL_BASED_OUTPATIENT_CLINIC_OR_DEPARTMENT_OTHER): Payer: Managed Care, Other (non HMO) | Admitting: Nurse Practitioner

## 2019-08-23 ENCOUNTER — Encounter: Payer: Self-pay | Admitting: Nurse Practitioner

## 2019-08-23 ENCOUNTER — Other Ambulatory Visit: Payer: Managed Care, Other (non HMO)

## 2019-08-23 ENCOUNTER — Inpatient Hospital Stay: Payer: Managed Care, Other (non HMO)

## 2019-08-23 ENCOUNTER — Other Ambulatory Visit: Payer: Self-pay

## 2019-08-23 VITALS — BP 135/83 | HR 97 | Temp 98.2°F | Resp 16 | Ht 71.0 in | Wt 189.5 lb

## 2019-08-23 DIAGNOSIS — Z5111 Encounter for antineoplastic chemotherapy: Secondary | ICD-10-CM | POA: Diagnosis not present

## 2019-08-23 DIAGNOSIS — C182 Malignant neoplasm of ascending colon: Secondary | ICD-10-CM | POA: Diagnosis not present

## 2019-08-23 MED ORDER — PALONOSETRON HCL INJECTION 0.25 MG/5ML
INTRAVENOUS | Status: AC
  Filled 2019-08-23: qty 5

## 2019-08-23 MED ORDER — DEXAMETHASONE SODIUM PHOSPHATE 10 MG/ML IJ SOLN
10.0000 mg | Freq: Once | INTRAMUSCULAR | Status: AC
  Administered 2019-08-23: 13:00:00 10 mg via INTRAVENOUS

## 2019-08-23 MED ORDER — PALONOSETRON HCL INJECTION 0.25 MG/5ML
0.2500 mg | Freq: Once | INTRAVENOUS | Status: AC
  Administered 2019-08-23: 13:00:00 0.25 mg via INTRAVENOUS

## 2019-08-23 MED ORDER — OXALIPLATIN CHEMO INJECTION 100 MG/20ML
130.0000 mg/m2 | Freq: Once | INTRAVENOUS | Status: AC
  Administered 2019-08-23: 260 mg via INTRAVENOUS
  Filled 2019-08-23: qty 52

## 2019-08-23 MED ORDER — PROCHLORPERAZINE MALEATE 10 MG PO TABS: 10.0000 mg | ORAL_TABLET | Freq: Four times a day (QID) | ORAL | 2 refills | Status: DC | PRN

## 2019-08-23 MED ORDER — DEXAMETHASONE SODIUM PHOSPHATE 10 MG/ML IJ SOLN
INTRAMUSCULAR | Status: AC
  Filled 2019-08-23: qty 1

## 2019-08-23 MED ORDER — DEXTROSE 5 % IV SOLN
Freq: Once | INTRAVENOUS | Status: AC
  Administered 2019-08-23: 13:00:00 via INTRAVENOUS
  Filled 2019-08-23: qty 250

## 2019-08-23 NOTE — Progress Notes (Addendum)
  [facility] OFFICE PROGRESS NOTE   Diagnosis: Colon cancer  INTERVAL HISTORY:   Mr. William Livingston returns as scheduled.  He overall feels well.  No nausea or vomiting.  Bowels moving regularly.  He has a good appetite.  Objective:  Vital signs in last 24 hours:  Blood pressure 135/83, pulse 97, temperature 98.2 F (36.8 C), temperature source Temporal, resp. rate 16, height 5\' 11"  (1.803 m), weight 189 lb 8 oz (86 kg), SpO2 99 %.    HEENT: No thrush or ulcers. GI: Abdomen soft and nontender.  Healed midline surgical incision. Vascular: No leg edema. Neuro: Alert and oriented. Skin: Palms without erythema.   Lab Results:  Lab Results  Component Value Date   WBC 4.6 08/17/2019   HGB 10.5 (L) 08/17/2019   HCT 35.9 (L) 08/17/2019   MCV 66.5 (L) 08/17/2019   PLT 391 08/17/2019   NEUTROABS 2.1 08/17/2019    Imaging:  No results found.  Medications: I have reviewed the patient's current medications.  Assessment/Plan: 1. 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 Grade 3, lymphovascular invasion present tumor invades visceral peritoneum, negative resection margins, 0/33 lymph nodes ? Colonoscopy 07/19/2019-ascending colon mass, polyps removed from the sigmoid colon and rectum-tubular adenoma and hyperplastic polyp, biopsy of ascending colon mass-high-grade poorly differentiated adenocarcinoma ? CT abdomen/pelvis 07/17/2019-inflammatory appearing mass in the mid ascending colon with several prominent surrounding lymph nodes ? CT chest 07/19/2019-no evidence of metastatic disease, 3 mm perifissural nodule in the right lung likely a subpleural lymph node ? Cycle 1 CAPOX 08/23/2019 2. Iron deficiency anemia secondary to #1 3. History left clavicle fracture 4. Family history of breast and prostate cancer 5. Vasectomy and vasectomy reversal  Disposition: [name] appears stable.  Plan to proceed with cycle 1  adjuvant CAPOX today as scheduled.  We again reviewed potential toxicities.  He agrees to proceed.  He will return for lab, follow-up, cycle 2 CAPOX in 3 weeks.  He will contact the office in the interim with any problems.  Patient seen with Dr. Benay Spice.    Ned Card ANP/GNP-BC   08/23/2019  11:30 AM This was a shared visit with Ned Card.  [name] will begin adjuvant chemotherapy today.  We reviewed potential toxicities including the chance of infertility.  He agrees to proceed.   Julieanne Manson, MD

## 2019-08-23 NOTE — Patient Instructions (Signed)
Oxaliplatin Injection What is this medicine? OXALIPLATIN (ox AL i PLA tin) is a chemotherapy drug. It targets fast dividing cells, like cancer cells, and causes these cells to die. This medicine is used to treat cancers of the colon and rectum, and many other cancers. This medicine may be used for other purposes; ask your health care provider or pharmacist if you have questions. COMMON BRAND NAME(S): Eloxatin What should I tell my health care provider before I take this medicine? They need to know if you have any of these conditions:  kidney disease  an unusual or allergic reaction to oxaliplatin, other chemotherapy, other medicines, foods, dyes, or preservatives  pregnant or trying to get pregnant  breast-feeding How should I use this medicine? This drug is given as an infusion into a vein. It is administered in a hospital or clinic by a specially trained health care professional. Talk to your pediatrician regarding the use of this medicine in children. Special care may be needed. Overdosage: If you think you have taken too much of this medicine contact a poison control center or emergency room at once. NOTE: This medicine is only for you. Do not share this medicine with others. What if I miss a dose? It is important not to miss a dose. Call your doctor or health care professional if you are unable to keep an appointment. What may interact with this medicine?  medicines to increase blood counts like filgrastim, pegfilgrastim, sargramostim  probenecid  some antibiotics like amikacin, gentamicin, neomycin, polymyxin B, streptomycin, tobramycin  zalcitabine Talk to your doctor or health care professional before taking any of these medicines:  acetaminophen  aspirin  ibuprofen  ketoprofen  naproxen This list may not describe all possible interactions. Give your health care provider a list of all the medicines, herbs, non-prescription drugs, or dietary supplements you use. Also  tell them if you smoke, drink alcohol, or use illegal drugs. Some items may interact with your medicine. What should I watch for while using this medicine? Your condition will be monitored carefully while you are receiving this medicine. You will need important blood work done while you are taking this medicine. This medicine can make you more sensitive to cold. Do not drink cold drinks or use ice. Cover exposed skin before coming in contact with cold temperatures or cold objects. When out in cold weather wear warm clothing and cover your mouth and nose to warm the air that goes into your lungs. Tell your doctor if you get sensitive to the cold. This drug may make you feel generally unwell. This is not uncommon, as chemotherapy can affect healthy cells as well as cancer cells. Report any side effects. Continue your course of treatment even though you feel ill unless your doctor tells you to stop. In some cases, you may be given additional medicines to help with side effects. Follow all directions for their use. Call your doctor or health care professional for advice if you get a fever, chills or sore throat, or other symptoms of a cold or flu. Do not treat yourself. This drug decreases your body's ability to fight infections. Try to avoid being around people who are sick. This medicine may increase your risk to bruise or bleed. Call your doctor or health care professional if you notice any unusual bleeding. Be careful brushing and flossing your teeth or using a toothpick because you may get an infection or bleed more easily. If you have any dental work done, tell your dentist you   are receiving this medicine. Avoid taking products that contain aspirin, acetaminophen, ibuprofen, naproxen, or ketoprofen unless instructed by your doctor. These medicines may hide a fever. Do not become pregnant while taking this medicine. Women should inform their doctor if they wish to become pregnant or think they might be  pregnant. There is a potential for serious side effects to an unborn child. Talk to your health care professional or pharmacist for more information. Do not breast-feed an infant while taking this medicine. Call your doctor or health care professional if you get diarrhea. Do not treat yourself. What side effects may I notice from receiving this medicine? Side effects that you should report to your doctor or health care professional as soon as possible:  allergic reactions like skin rash, itching or hives, swelling of the face, lips, or tongue  low blood counts - This drug may decrease the number of white blood cells, red blood cells and platelets. You may be at increased risk for infections and bleeding.  signs of infection - fever or chills, cough, sore throat, pain or difficulty passing urine  signs of decreased platelets or bleeding - bruising, pinpoint red spots on the skin, black, tarry stools, nosebleeds  signs of decreased red blood cells - unusually weak or tired, fainting spells, lightheadedness  breathing problems  chest pain, pressure  cough  diarrhea  jaw tightness  mouth sores  nausea and vomiting  pain, swelling, redness or irritation at the injection site  pain, tingling, numbness in the hands or feet  problems with balance, talking, walking  redness, blistering, peeling or loosening of the skin, including inside the mouth  trouble passing urine or change in the amount of urine Side effects that usually do not require medical attention (report to your doctor or health care professional if they continue or are bothersome):  changes in vision  constipation  hair loss  loss of appetite  metallic taste in the mouth or changes in taste  stomach pain This list may not describe all possible side effects. Call your doctor for medical advice about side effects. You may report side effects to FDA at 1-800-FDA-1088. Where should I keep my medicine? This drug  is given in a hospital or clinic and will not be stored at home. NOTE: This sheet is a summary. It may not cover all possible information. If you have questions about this medicine, talk to your doctor, pharmacist, or health care provider.  2020 Elsevier/Gold Standard (2008-05-07 17:22:47)

## 2019-08-23 NOTE — Progress Notes (Signed)
Met with patient at lobby to introduce myself as Arboriculturist and to offer available resources.  Discussed one-time $88 Engineer, drilling to assist with personal expenses while going through treatment.  Gave him my card if interested in applying and for any additional financial questions or concerns.

## 2019-08-24 ENCOUNTER — Telehealth: Payer: Self-pay | Admitting: *Deleted

## 2019-08-24 MED ORDER — IBUPROFEN 200 MG PO TABS: 600.0000 mg | ORAL_TABLET | Freq: Three times a day (TID) | ORAL | 0 refills | Status: DC | PRN

## 2019-08-24 NOTE — Telephone Encounter (Addendum)
Patient called requesting refill on his oxycodone for his arm pain r/t oxaliplatin given peripherally yesterday. Reports it bothers him more at night. Also asking if it will hurt his dog to lick his leg (dog follows him around and licks his sweat)?  Per Dr. Benay Spice: Try OTC ibuprofen 600 mg tid and warm compresses for arm discomfort. He agrees to try this over the weekend. Instructed him to try to discourage the dog from licking his leg since he is taking chemo every day. He requested our fax # so an organization that is planning to help them with fertility (IVS) financially can send papers for MD to sign that he has cancer. Confirmed he has already donated the sperm prior to treatment. He denies any nausea or adverse effect from treatment otherwise. Mild cold sensitivity. Faxed completed form to [facility] after MD signed and dated as patient requested.

## 2019-08-28 ENCOUNTER — Encounter (HOSPITAL_COMMUNITY): Payer: Self-pay

## 2019-09-09 ENCOUNTER — Other Ambulatory Visit: Payer: Self-pay | Admitting: Oncology

## 2019-09-10 ENCOUNTER — Other Ambulatory Visit: Payer: Self-pay | Admitting: *Deleted

## 2019-09-10 DIAGNOSIS — C182 Malignant neoplasm of ascending colon: Secondary | ICD-10-CM

## 2019-09-10 MED ORDER — CAPECITABINE 500 MG PO TABS: ORAL_TABLET | ORAL | 0 refills | Status: DC

## 2019-09-13 ENCOUNTER — Inpatient Hospital Stay (HOSPITAL_BASED_OUTPATIENT_CLINIC_OR_DEPARTMENT_OTHER): Payer: Managed Care, Other (non HMO) | Admitting: Oncology

## 2019-09-13 ENCOUNTER — Other Ambulatory Visit: Payer: Self-pay

## 2019-09-13 ENCOUNTER — Inpatient Hospital Stay: Payer: Managed Care, Other (non HMO)

## 2019-09-13 ENCOUNTER — Inpatient Hospital Stay: Payer: Managed Care, Other (non HMO) | Attending: Oncology

## 2019-09-13 VITALS — BP 135/69 | HR 67 | Temp 98.2°F | Resp 18 | Ht 71.0 in | Wt 194.9 lb

## 2019-09-13 DIAGNOSIS — C182 Malignant neoplasm of ascending colon: Secondary | ICD-10-CM | POA: Insufficient documentation

## 2019-09-13 DIAGNOSIS — M79602 Pain in left arm: Secondary | ICD-10-CM | POA: Diagnosis not present

## 2019-09-13 DIAGNOSIS — Z5111 Encounter for antineoplastic chemotherapy: Secondary | ICD-10-CM | POA: Insufficient documentation

## 2019-09-13 DIAGNOSIS — R197 Diarrhea, unspecified: Secondary | ICD-10-CM | POA: Diagnosis not present

## 2019-09-13 DIAGNOSIS — D630 Anemia in neoplastic disease: Secondary | ICD-10-CM | POA: Diagnosis not present

## 2019-09-13 DIAGNOSIS — D701 Agranulocytosis secondary to cancer chemotherapy: Secondary | ICD-10-CM | POA: Diagnosis not present

## 2019-09-13 LAB — CMP (CANCER CENTER ONLY)
ALT: 26 U/L (ref 0–44)
AST: 22 U/L (ref 15–41)
Albumin: 4.3 g/dL (ref 3.5–5.0)
Alkaline Phosphatase: 94 U/L (ref 38–126)
Anion gap: 10 (ref 5–15)
BUN: 9 mg/dL (ref 6–20)
CO2: 24 mmol/L (ref 22–32)
Calcium: 9.2 mg/dL (ref 8.9–10.3)
Chloride: 104 mmol/L (ref 98–111)
Creatinine: 0.89 mg/dL (ref 0.61–1.24)
GFR, Est AFR Am: >60 mL/min (ref >60)
GFR, Estimated: >60 mL/min (ref >60)
Glucose, Bld: 90 mg/dL (ref 70–99)
Potassium: 4.3 mmol/L (ref 3.5–5.1)
Sodium: 138 mmol/L (ref 135–145)
Total Bilirubin: 0.9 mg/dL (ref 0.3–1.2)
Total Protein: 7.5 g/dL (ref 6.5–8.1)

## 2019-09-13 LAB — CBC WITH DIFFERENTIAL (CANCER CENTER ONLY)
Abs Immature Granulocytes: 0.01 10*3/uL (ref 0.00–0.07)
Basophils Absolute: 0 10*3/uL (ref 0.0–0.1)
Basophils Relative: 1 %
Eosinophils Absolute: 0.1 10*3/uL (ref 0.0–0.5)
Eosinophils Relative: 3 %
HCT: 39.7 % (ref 39.0–52.0)
Hemoglobin: 11.9 g/dL — ABNORMAL LOW (ref 13.0–17.0)
Immature Granulocytes: 0 %
Lymphocytes Relative: 31 %
Lymphs Abs: 1.2 10*3/uL (ref 0.7–4.0)
MCH: 21.4 pg — ABNORMAL LOW (ref 26.0–34.0)
MCHC: 30 g/dL (ref 30.0–36.0)
MCV: 71.3 fL — ABNORMAL LOW (ref 80.0–100.0)
Monocytes Absolute: 0.9 10*3/uL (ref 0.1–1.0)
Monocytes Relative: 24 %
Neutro Abs: 1.6 10*3/uL — ABNORMAL LOW (ref 1.7–7.7)
Neutrophils Relative %: 41 %
Platelet Count: 262 10*3/uL (ref 150–400)
RBC: 5.57 MIL/uL (ref 4.22–5.81)
RDW: 28.7 % — ABNORMAL HIGH (ref 11.5–15.5)
WBC Count: 3.9 10*3/uL — ABNORMAL LOW (ref 4.0–10.5)
nRBC: 0 % (ref 0.0–0.2)

## 2019-09-13 MED ORDER — PALONOSETRON HCL INJECTION 0.25 MG/5ML
INTRAVENOUS | Status: AC
  Filled 2019-09-13: qty 5

## 2019-09-13 MED ORDER — OXALIPLATIN CHEMO INJECTION 100 MG/20ML
100.0000 mg/m2 | Freq: Once | INTRAVENOUS | Status: AC
  Administered 2019-09-13: 200 mg via INTRAVENOUS
  Filled 2019-09-13: qty 40

## 2019-09-13 MED ORDER — PALONOSETRON HCL INJECTION 0.25 MG/5ML
0.2500 mg | Freq: Once | INTRAVENOUS | Status: AC
  Administered 2019-09-13: 0.25 mg via INTRAVENOUS

## 2019-09-13 MED ORDER — DEXTROSE 5 % IV SOLN
Freq: Once | INTRAVENOUS | Status: AC
  Administered 2019-09-13: 10:00:00 via INTRAVENOUS
  Filled 2019-09-13: qty 250

## 2019-09-13 MED ORDER — SODIUM CHLORIDE 0.9 % IV SOLN
Freq: Once | INTRAVENOUS | Status: AC
  Administered 2019-09-13: 10:00:00 via INTRAVENOUS
  Filled 2019-09-13: qty 5

## 2019-09-13 NOTE — Progress Notes (Signed)
[facility] OFFICE PROGRESS NOTE   Diagnosis: Colon cancer  INTERVAL HISTORY:   Mr. Gradillas completed cycle 1 CAPOX beginning 08/23/2019.  He had nausea beginning 2 days following chemotherapy and lasting for several days.  No vomiting.  He had cold sensitivity following chemotherapy.  This has resolved.  No other neuropathy symptoms.  He has noted 1 ulcer at the lower inner lip.  He reports mild diarrhea over the last several days of the capecitabine treatment.  This has resolved.  Imodium helped. He is exercising and has noted an increase exercise tolerance.  He reports mild discomfort at the upper aspect of the abdominal wound.  He had pain in the left arm following the left arm oxaliplatin infusion.  He has persistent mild discomfort in the left arm. Objective:  Vital signs in last 24 hours:  Blood pressure 135/69, pulse 67, temperature 98.2 F (36.8 C), temperature source Temporal, resp. rate 18, height 5\' 11"  (1.803 m), weight 194 lb 14.4 oz (88.4 kg), SpO2 100 %.    HEENT: Small ulceration at the lower inner lip, mild white coating over the tongue, no other ulcers GI: No hepatomegaly, no mass, mild tenderness at the upper portion of the midline incision.  No evidence of infection. Vascular: No leg edema, left arm without erythema, swelling, or palpable cord. Skin: Palms without erythema    Lab Results:  Lab Results  Component Value Date   WBC 3.9 (L) 09/13/2019   HGB 11.9 (L) 09/13/2019   HCT 39.7 09/13/2019   MCV 71.3 (L) 09/13/2019   PLT 262 09/13/2019   NEUTROABS 1.6 (L) 09/13/2019    CMP  Lab Results  Component Value Date   NA 138 09/13/2019   K 4.3 09/13/2019   CL 104 09/13/2019   CO2 24 09/13/2019   GLUCOSE 90 09/13/2019   BUN 9 09/13/2019   CREATININE 0.89 09/13/2019   CALCIUM 9.2 09/13/2019   PROT 7.5 09/13/2019   ALBUMIN 4.3 09/13/2019   AST 22 09/13/2019   ALT 26 09/13/2019   ALKPHOS 94 09/13/2019   BILITOT 0.9 09/13/2019   GFRNONAA >60 09/13/2019   GFRAA >60 09/13/2019    Lab Results  Component Value Date   CEA1 1.7 07/18/2019    Medications: I have reviewed the patient's current medications.   Assessment/Plan: 1. 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 Grade 3, lymphovascular invasion present tumor invades visceral peritoneum, negative resection margins, 0/33 lymph nodes ? Colonoscopy 07/19/2019-ascending colon mass, polyps removed from the sigmoid colon and rectum-tubular adenoma and hyperplastic polyp, biopsy of ascending colon mass-high-grade poorly differentiated adenocarcinoma ? CT abdomen/pelvis 07/17/2019-inflammatory appearing mass in the mid ascending colon with several prominent surrounding lymph nodes ? CT chest 07/19/2019-no evidence of metastatic disease, 3 mm perifissural nodule in the right lung likely a subpleural lymph node ? Cycle 1 CAPOX 08/23/2019 ? Cycle 2 CAPOX 09/13/2019 (oxaliplatin dose reduced and Emend added) 2. Iron deficiency anemia secondary to #1 3. History left clavicle fracture 4. Family history of breast and prostate cancer 5. Vasectomy and vasectomy reversal 6. Mild neutropenia secondary to chemotherapy    Disposition: Mr. Whelihan has completed 1 cycle of CAPOX.  He tolerated the chemotherapy well.  The plan is to proceed with cycle 2 today.  He has mild neutropenia.  We discussed delaying treatment versus a dose reduction.  We are not able to add G-CSF support with the capecitabine therapy.  We decided to dose reduce  the oxaliplatin and proceed with chemotherapy today.  He will call for a fever or symptoms of an infection.  He will return for a nadir CBC.  Mr. Brenden had nausea following cycle 1.  Emend will be added with chemotherapy today. He will return for an office visit and chemotherapy in 3 weeks. Betsy Coder, MD  09/13/2019  9:52 AM

## 2019-09-13 NOTE — Patient Instructions (Signed)
Oxaliplatin Injection What is this medicine? OXALIPLATIN (ox AL i PLA tin) is a chemotherapy drug. It targets fast dividing cells, like cancer cells, and causes these cells to die. This medicine is used to treat cancers of the colon and rectum, and many other cancers. This medicine may be used for other purposes; ask your health care provider or pharmacist if you have questions. COMMON BRAND NAME(S): Eloxatin What should I tell my health care provider before I take this medicine? They need to know if you have any of these conditions:  kidney disease  an unusual or allergic reaction to oxaliplatin, other chemotherapy, other medicines, foods, dyes, or preservatives  pregnant or trying to get pregnant  breast-feeding How should I use this medicine? This drug is given as an infusion into a vein. It is administered in a hospital or clinic by a specially trained health care professional. Talk to your pediatrician regarding the use of this medicine in children. Special care may be needed. Overdosage: If you think you have taken too much of this medicine contact a poison control center or emergency room at once. NOTE: This medicine is only for you. Do not share this medicine with others. What if I miss a dose? It is important not to miss a dose. Call your doctor or health care professional if you are unable to keep an appointment. What may interact with this medicine?  medicines to increase blood counts like filgrastim, pegfilgrastim, sargramostim  probenecid  some antibiotics like amikacin, gentamicin, neomycin, polymyxin B, streptomycin, tobramycin  zalcitabine Talk to your doctor or health care professional before taking any of these medicines:  acetaminophen  aspirin  ibuprofen  ketoprofen  naproxen This list may not describe all possible interactions. Give your health care provider a list of all the medicines, herbs, non-prescription drugs, or dietary supplements you use. Also  tell them if you smoke, drink alcohol, or use illegal drugs. Some items may interact with your medicine. What should I watch for while using this medicine? Your condition will be monitored carefully while you are receiving this medicine. You will need important blood work done while you are taking this medicine. This medicine can make you more sensitive to cold. Do not drink cold drinks or use ice. Cover exposed skin before coming in contact with cold temperatures or cold objects. When out in cold weather wear warm clothing and cover your mouth and nose to warm the air that goes into your lungs. Tell your doctor if you get sensitive to the cold. This drug may make you feel generally unwell. This is not uncommon, as chemotherapy can affect healthy cells as well as cancer cells. Report any side effects. Continue your course of treatment even though you feel ill unless your doctor tells you to stop. In some cases, you may be given additional medicines to help with side effects. Follow all directions for their use. Call your doctor or health care professional for advice if you get a fever, chills or sore throat, or other symptoms of a cold or flu. Do not treat yourself. This drug decreases your body's ability to fight infections. Try to avoid being around people who are sick. This medicine may increase your risk to bruise or bleed. Call your doctor or health care professional if you notice any unusual bleeding. Be careful brushing and flossing your teeth or using a toothpick because you may get an infection or bleed more easily. If you have any dental work done, tell your dentist you   are receiving this medicine. Avoid taking products that contain aspirin, acetaminophen, ibuprofen, naproxen, or ketoprofen unless instructed by your doctor. These medicines may hide a fever. Do not become pregnant while taking this medicine. Women should inform their doctor if they wish to become pregnant or think they might be  pregnant. There is a potential for serious side effects to an unborn child. Talk to your health care professional or pharmacist for more information. Do not breast-feed an infant while taking this medicine. Call your doctor or health care professional if you get diarrhea. Do not treat yourself. What side effects may I notice from receiving this medicine? Side effects that you should report to your doctor or health care professional as soon as possible:  allergic reactions like skin rash, itching or hives, swelling of the face, lips, or tongue  low blood counts - This drug may decrease the number of white blood cells, red blood cells and platelets. You may be at increased risk for infections and bleeding.  signs of infection - fever or chills, cough, sore throat, pain or difficulty passing urine  signs of decreased platelets or bleeding - bruising, pinpoint red spots on the skin, black, tarry stools, nosebleeds  signs of decreased red blood cells - unusually weak or tired, fainting spells, lightheadedness  breathing problems  chest pain, pressure  cough  diarrhea  jaw tightness  mouth sores  nausea and vomiting  pain, swelling, redness or irritation at the injection site  pain, tingling, numbness in the hands or feet  problems with balance, talking, walking  redness, blistering, peeling or loosening of the skin, including inside the mouth  trouble passing urine or change in the amount of urine Side effects that usually do not require medical attention (report to your doctor or health care professional if they continue or are bothersome):  changes in vision  constipation  hair loss  loss of appetite  metallic taste in the mouth or changes in taste  stomach pain This list may not describe all possible side effects. Call your doctor for medical advice about side effects. You may report side effects to FDA at 1-800-FDA-1088. Where should I keep my medicine? This drug  is given in a hospital or clinic and will not be stored at home. NOTE: This sheet is a summary. It may not cover all possible information. If you have questions about this medicine, talk to your doctor, pharmacist, or health care provider.  2020 Elsevier/Gold Standard (2008-05-07 17:22:47)

## 2019-09-13 NOTE — Progress Notes (Signed)
Per Dr. Benay Spice: OK to treat today w/ANC 1.6. Will dose reduce oxaliplatin.

## 2019-09-14 ENCOUNTER — Telehealth: Payer: Self-pay | Admitting: Oncology

## 2019-09-14 NOTE — Telephone Encounter (Signed)
Scheduled per los. Called and spoke with patient. Confirmed appt 

## 2019-09-24 ENCOUNTER — Other Ambulatory Visit: Payer: Managed Care, Other (non HMO)

## 2019-09-24 ENCOUNTER — Inpatient Hospital Stay: Payer: Managed Care, Other (non HMO)

## 2019-09-24 ENCOUNTER — Other Ambulatory Visit: Payer: Self-pay

## 2019-09-24 DIAGNOSIS — C182 Malignant neoplasm of ascending colon: Secondary | ICD-10-CM

## 2019-09-24 DIAGNOSIS — Z5111 Encounter for antineoplastic chemotherapy: Secondary | ICD-10-CM | POA: Diagnosis not present

## 2019-09-24 LAB — CBC WITH DIFFERENTIAL (CANCER CENTER ONLY)
Abs Immature Granulocytes: 0.02 10*3/uL (ref 0.00–0.07)
Basophils Absolute: 0 10*3/uL (ref 0.0–0.1)
Basophils Relative: 0 %
Eosinophils Absolute: 0.1 10*3/uL (ref 0.0–0.5)
Eosinophils Relative: 2 %
HCT: 38 % — ABNORMAL LOW (ref 39.0–52.0)
Hemoglobin: 11.6 g/dL — ABNORMAL LOW (ref 13.0–17.0)
Immature Granulocytes: 0 %
Lymphocytes Relative: 27 %
Lymphs Abs: 1.3 10*3/uL (ref 0.7–4.0)
MCH: 22.4 pg — ABNORMAL LOW (ref 26.0–34.0)
MCHC: 30.5 g/dL (ref 30.0–36.0)
MCV: 73.2 fL — ABNORMAL LOW (ref 80.0–100.0)
Monocytes Absolute: 0.7 10*3/uL (ref 0.1–1.0)
Monocytes Relative: 16 %
Neutro Abs: 2.6 10*3/uL (ref 1.7–7.7)
Neutrophils Relative %: 55 %
Platelet Count: 193 10*3/uL (ref 150–400)
RBC: 5.19 MIL/uL (ref 4.22–5.81)
RDW: 28.9 % — ABNORMAL HIGH (ref 11.5–15.5)
WBC Count: 4.8 10*3/uL (ref 4.0–10.5)
nRBC: 0 % (ref 0.0–0.2)

## 2019-09-25 ENCOUNTER — Telehealth: Payer: Self-pay | Admitting: *Deleted

## 2019-09-25 NOTE — Telephone Encounter (Signed)
Per Dr. Benay Spice, called to make pt aware that his neutrophil count is higher and f/u as scheduled. Pt verbalized understanding.

## 2019-09-25 NOTE — Telephone Encounter (Signed)
-----   Message from Ladell Pier, MD sent at 09/24/2019  4:37 PM EST ----- Please call patient, neutrophil count is higher, follow-up as scheduled

## 2019-09-27 ENCOUNTER — Other Ambulatory Visit: Payer: Self-pay | Admitting: *Deleted

## 2019-09-27 DIAGNOSIS — C182 Malignant neoplasm of ascending colon: Secondary | ICD-10-CM

## 2019-09-27 MED ORDER — CAPECITABINE 500 MG PO TABS: ORAL_TABLET | ORAL | 0 refills | Status: DC

## 2019-09-27 NOTE — Telephone Encounter (Signed)
Patient left VM that he finished his Xeloda today and needs refill. Refill approved and e-scribed

## 2019-09-30 ENCOUNTER — Other Ambulatory Visit: Payer: Self-pay | Admitting: Oncology

## 2019-10-03 ENCOUNTER — Telehealth: Payer: Self-pay | Admitting: Oncology

## 2019-10-03 NOTE — Telephone Encounter (Signed)
Scheduled appt per 12/9 sch message - pt aware of appt date and time

## 2019-10-04 ENCOUNTER — Inpatient Hospital Stay: Payer: Managed Care, Other (non HMO)

## 2019-10-04 ENCOUNTER — Other Ambulatory Visit: Payer: Self-pay

## 2019-10-04 ENCOUNTER — Inpatient Hospital Stay: Payer: Managed Care, Other (non HMO) | Attending: Oncology | Admitting: Oncology

## 2019-10-04 VITALS — BP 143/85 | HR 62 | Temp 98.6°F | Resp 17 | Ht 71.0 in | Wt 200.0 lb

## 2019-10-04 DIAGNOSIS — D701 Agranulocytosis secondary to cancer chemotherapy: Secondary | ICD-10-CM | POA: Insufficient documentation

## 2019-10-04 DIAGNOSIS — C182 Malignant neoplasm of ascending colon: Secondary | ICD-10-CM | POA: Diagnosis not present

## 2019-10-04 DIAGNOSIS — Z5111 Encounter for antineoplastic chemotherapy: Secondary | ICD-10-CM | POA: Diagnosis present

## 2019-10-04 DIAGNOSIS — D630 Anemia in neoplastic disease: Secondary | ICD-10-CM | POA: Diagnosis not present

## 2019-10-04 LAB — CBC WITH DIFFERENTIAL (CANCER CENTER ONLY)
Abs Immature Granulocytes: 0.01 10*3/uL (ref 0.00–0.07)
Basophils Absolute: 0 10*3/uL (ref 0.0–0.1)
Basophils Relative: 1 %
Eosinophils Absolute: 0.2 10*3/uL (ref 0.0–0.5)
Eosinophils Relative: 5 %
HCT: 40.2 % (ref 39.0–52.0)
Hemoglobin: 12.5 g/dL — ABNORMAL LOW (ref 13.0–17.0)
Immature Granulocytes: 0 %
Lymphocytes Relative: 32 %
Lymphs Abs: 1.2 10*3/uL (ref 0.7–4.0)
MCH: 23 pg — ABNORMAL LOW (ref 26.0–34.0)
MCHC: 31.1 g/dL (ref 30.0–36.0)
MCV: 73.9 fL — ABNORMAL LOW (ref 80.0–100.0)
Monocytes Absolute: 0.6 10*3/uL (ref 0.1–1.0)
Monocytes Relative: 15 %
Neutro Abs: 1.8 10*3/uL (ref 1.7–7.7)
Neutrophils Relative %: 47 %
Platelet Count: 241 10*3/uL (ref 150–400)
RBC: 5.44 MIL/uL (ref 4.22–5.81)
RDW: 30.7 % — ABNORMAL HIGH (ref 11.5–15.5)
WBC Count: 3.9 10*3/uL — ABNORMAL LOW (ref 4.0–10.5)
nRBC: 0 % (ref 0.0–0.2)

## 2019-10-04 LAB — CMP (CANCER CENTER ONLY)
ALT: 29 U/L (ref 0–44)
AST: 24 U/L (ref 15–41)
Albumin: 4.2 g/dL (ref 3.5–5.0)
Alkaline Phosphatase: 106 U/L (ref 38–126)
Anion gap: 10 (ref 5–15)
BUN: 13 mg/dL (ref 6–20)
CO2: 24 mmol/L (ref 22–32)
Calcium: 9 mg/dL (ref 8.9–10.3)
Chloride: 105 mmol/L (ref 98–111)
Creatinine: 0.88 mg/dL (ref 0.61–1.24)
GFR, Est AFR Am: >60 mL/min (ref >60)
GFR, Estimated: >60 mL/min (ref >60)
Glucose, Bld: 111 mg/dL — ABNORMAL HIGH (ref 70–99)
Potassium: 4.2 mmol/L (ref 3.5–5.1)
Sodium: 139 mmol/L (ref 135–145)
Total Bilirubin: 0.8 mg/dL (ref 0.3–1.2)
Total Protein: 7.3 g/dL (ref 6.5–8.1)

## 2019-10-04 MED ORDER — OXALIPLATIN CHEMO INJECTION 100 MG/20ML
100.0000 mg/m2 | Freq: Once | INTRAVENOUS | Status: AC
  Administered 2019-10-04: 11:00:00 200 mg via INTRAVENOUS
  Filled 2019-10-04: qty 40

## 2019-10-04 MED ORDER — PALONOSETRON HCL INJECTION 0.25 MG/5ML
0.2500 mg | Freq: Once | INTRAVENOUS | Status: AC
  Administered 2019-10-04: 0.25 mg via INTRAVENOUS

## 2019-10-04 MED ORDER — PALONOSETRON HCL INJECTION 0.25 MG/5ML
INTRAVENOUS | Status: AC
  Filled 2019-10-04: qty 5

## 2019-10-04 MED ORDER — DEXTROSE 5 % IV SOLN
Freq: Once | INTRAVENOUS | Status: AC
  Administered 2019-10-04: 09:00:00 via INTRAVENOUS
  Filled 2019-10-04: qty 250

## 2019-10-04 MED ORDER — SODIUM CHLORIDE 0.9 % IV SOLN
Freq: Once | INTRAVENOUS | Status: AC
  Administered 2019-10-04: 09:00:00 via INTRAVENOUS
  Filled 2019-10-04: qty 5

## 2019-10-04 NOTE — Progress Notes (Signed)
[facility] [facility] OFFICE PROGRESS NOTE   Diagnosis: Colon cancer  INTERVAL HISTORY:   Mr. Ebbs completed another cycle of CAPOX beginning 09/13/2019.  He reports increased fatigue following this cycle of chemotherapy.  He had nausea beginning 1-2 days following chemotherapy and lasting for several days.  He had one episode of emesis.  He reports diarrhea up to 2-3 times per day toward the end of the capecitabine cycle.  No diarrhea present.  He had cold sensitivity following chemotherapy.  This has resolved.  No neuropathy symptoms at present.  He reports discomfort at the right arm IV site and now has a palpable cord.  No redness or swelling.  1 mouth sore has resolved.  Objective:  Vital signs in last 24 hours:  Blood pressure (!) 143/85, pulse 62, temperature 98.6 F (37 C), temperature source Temporal, resp. rate 17, height 5\' 11"  (1.803 m), weight 200 lb (90.7 kg), SpO2 100 %.    HEENT: No thrush or ulcers GI: No hepatomegaly, nontender Vascular: No arm or leg edema, palpable cord at the right distal forearm IV site.  No erythema or swelling.  Skin: Palms without erythema    Lab Results:  Lab Results  Component Value Date   WBC 3.9 (L) 10/04/2019   HGB 12.5 (L) 10/04/2019   HCT 40.2 10/04/2019   MCV 73.9 (L) 10/04/2019   PLT 241 10/04/2019   NEUTROABS 1.8 10/04/2019    CMP  Lab Results  Component Value Date   NA 138 09/13/2019   K 4.3 09/13/2019   CL 104 09/13/2019   CO2 24 09/13/2019   GLUCOSE 90 09/13/2019   BUN 9 09/13/2019   CREATININE 0.89 09/13/2019   CALCIUM 9.2 09/13/2019   PROT 7.5 09/13/2019   ALBUMIN 4.3 09/13/2019   AST 22 09/13/2019   ALT 26 09/13/2019   ALKPHOS 94 09/13/2019   BILITOT 0.9 09/13/2019   GFRNONAA >60 09/13/2019   GFRAA >60 09/13/2019    Lab Results  Component Value Date   CEA1 1.7 07/18/2019     Medications: I have reviewed the patient's current medications.   Assessment/Plan: 1. Adenocarcinoma of the  ascending colon, stage IIb (T4N0), status post a right colectomy 07/23/2019 ? Grade 3, lymphovascular invasion present tumor invades visceral peritoneum, negative resection margins, 0/33 lymph nodes ? Colonoscopy 07/19/2019-ascending colon mass, polyps removed from the sigmoid colon and rectum-tubular adenoma and hyperplastic polyp, biopsy of ascending colon mass-high-grade poorly differentiated adenocarcinoma ? CT abdomen/pelvis 07/17/2019-inflammatory appearing mass in the mid ascending colon with several prominent surrounding lymph nodes ? CT chest 07/19/2019-no evidence of metastatic disease, 3 mm perifissural nodule in the right lung likely a subpleural lymph node ? Cycle 1 CAPOX 08/23/2019 ? Cycle 2 CAPOX 09/13/2019 (oxaliplatin dose reduced and Emend added) ? Cycle 3 CAPOX 10/04/2019 2. Iron deficiency anemia secondary to #1 3. History left clavicle fracture 4. Family history of breast and prostate cancer 5. Vasectomy and vasectomy reversal 6. Mild neutropenia secondary to chemotherapy  Disposition: Mr. Alberta has completed 2 cycles of CAPOX.  He is tolerating the chemotherapy well.  We discussed Decadron prophylaxis for delayed nausea.  He would like to hold on this for now.  He will contact us for nausea following the cycle of chemotherapy.  He will complete cycle 3 CAPOX today.  He has a palpable cord at the right arm IV site.  No evidence of phlebitis.  He will contact us for arm erythema or swelling.  Mr. Garnet will return for an  office visit prior to cycle 4 CAPOX on 10/25/2019.  Betsy Coder, MD  10/04/2019  8:35 AM

## 2019-10-04 NOTE — Patient Instructions (Signed)
[facility] Discharge Instructions for Patients Receiving Chemotherapy  Today you received the following chemotherapy agents Oxaliplatin.  To help prevent nausea and vomiting after your treatment, we encourage you to take your nausea medication DO NOT TAKE ZOFRAN FOR THREE DAYS AFTER TREATMENT.   If you develop nausea and vomiting that is not controlled by your nausea medication, call the clinic.   BELOW ARE SYMPTOMS THAT SHOULD BE REPORTED IMMEDIATELY:  *FEVER GREATER THAN 100.5 F  *CHILLS WITH OR WITHOUT FEVER  NAUSEA AND VOMITING THAT IS NOT CONTROLLED WITH YOUR NAUSEA MEDICATION  *UNUSUAL SHORTNESS OF BREATH  *UNUSUAL BRUISING OR BLEEDING  TENDERNESS IN MOUTH AND THROAT WITH OR WITHOUT PRESENCE OF ULCERS  *URINARY PROBLEMS  *BOWEL PROBLEMS  UNUSUAL RASH Items with * indicate a potential emergency and should be followed up as soon as possible.  Feel free to call the clinic should you have any questions or concerns. The clinic phone number is [phone].  Please show the [facility] at check-in to the Emergency Department and triage nurse.

## 2019-10-17 ENCOUNTER — Other Ambulatory Visit: Payer: Self-pay | Admitting: *Deleted

## 2019-10-17 DIAGNOSIS — C182 Malignant neoplasm of ascending colon: Secondary | ICD-10-CM

## 2019-10-17 MED ORDER — CAPECITABINE 500 MG PO TABS: ORAL_TABLET | ORAL | 0 refills | Status: DC

## 2019-10-21 ENCOUNTER — Other Ambulatory Visit: Payer: Self-pay | Admitting: Oncology

## 2019-10-25 ENCOUNTER — Ambulatory Visit: Payer: Managed Care, Other (non HMO) | Admitting: Nurse Practitioner

## 2019-10-25 ENCOUNTER — Inpatient Hospital Stay: Payer: Managed Care, Other (non HMO)

## 2019-10-25 ENCOUNTER — Other Ambulatory Visit: Payer: Self-pay

## 2019-10-25 ENCOUNTER — Other Ambulatory Visit: Payer: Managed Care, Other (non HMO)

## 2019-10-25 ENCOUNTER — Inpatient Hospital Stay (HOSPITAL_BASED_OUTPATIENT_CLINIC_OR_DEPARTMENT_OTHER): Payer: Managed Care, Other (non HMO) | Admitting: Oncology

## 2019-10-25 VITALS — BP 133/95 | HR 100 | Temp 98.5°F | Resp 17 | Ht 71.0 in | Wt 201.8 lb

## 2019-10-25 DIAGNOSIS — C182 Malignant neoplasm of ascending colon: Secondary | ICD-10-CM

## 2019-10-25 DIAGNOSIS — Z5111 Encounter for antineoplastic chemotherapy: Secondary | ICD-10-CM | POA: Diagnosis not present

## 2019-10-25 LAB — CMP (CANCER CENTER ONLY)
ALT: 51 U/L — ABNORMAL HIGH (ref 0–44)
AST: 40 U/L (ref 15–41)
Albumin: 4.2 g/dL (ref 3.5–5.0)
Alkaline Phosphatase: 100 U/L (ref 38–126)
Anion gap: 11 (ref 5–15)
BUN: 15 mg/dL (ref 6–20)
CO2: 25 mmol/L (ref 22–32)
Calcium: 9.3 mg/dL (ref 8.9–10.3)
Chloride: 103 mmol/L (ref 98–111)
Creatinine: 0.88 mg/dL (ref 0.61–1.24)
GFR, Est AFR Am: >60 mL/min (ref >60)
GFR, Estimated: >60 mL/min (ref >60)
Glucose, Bld: 101 mg/dL — ABNORMAL HIGH (ref 70–99)
Potassium: 4.1 mmol/L (ref 3.5–5.1)
Sodium: 139 mmol/L (ref 135–145)
Total Bilirubin: 0.8 mg/dL (ref 0.3–1.2)
Total Protein: 7.3 g/dL (ref 6.5–8.1)

## 2019-10-25 LAB — CBC WITH DIFFERENTIAL (CANCER CENTER ONLY)
Abs Immature Granulocytes: 0.02 10*3/uL (ref 0.00–0.07)
Basophils Absolute: 0 10*3/uL (ref 0.0–0.1)
Basophils Relative: 1 %
Eosinophils Absolute: 0.1 10*3/uL (ref 0.0–0.5)
Eosinophils Relative: 2 %
HCT: 40 % (ref 39.0–52.0)
Hemoglobin: 13 g/dL (ref 13.0–17.0)
Immature Granulocytes: 0 %
Lymphocytes Relative: 20 %
Lymphs Abs: 1.1 10*3/uL (ref 0.7–4.0)
MCH: 24.5 pg — ABNORMAL LOW (ref 26.0–34.0)
MCHC: 32.5 g/dL (ref 30.0–36.0)
MCV: 75.3 fL — ABNORMAL LOW (ref 80.0–100.0)
Monocytes Absolute: 0.9 10*3/uL (ref 0.1–1.0)
Monocytes Relative: 15 %
Neutro Abs: 3.4 10*3/uL (ref 1.7–7.7)
Neutrophils Relative %: 62 %
Platelet Count: 242 10*3/uL (ref 150–400)
RBC: 5.31 MIL/uL (ref 4.22–5.81)
RDW: 30 % — ABNORMAL HIGH (ref 11.5–15.5)
WBC Count: 5.5 10*3/uL (ref 4.0–10.5)
nRBC: 0 % (ref 0.0–0.2)

## 2019-10-25 MED ORDER — DEXTROSE 5 % IV SOLN
Freq: Once | INTRAVENOUS | Status: AC
  Filled 2019-10-25: qty 250

## 2019-10-25 MED ORDER — OXALIPLATIN CHEMO INJECTION 100 MG/20ML
130.0000 mg/m2 | Freq: Once | INTRAVENOUS | Status: AC
  Administered 2019-10-25: 260 mg via INTRAVENOUS
  Filled 2019-10-25: qty 52

## 2019-10-25 MED ORDER — PALONOSETRON HCL INJECTION 0.25 MG/5ML
0.2500 mg | Freq: Once | INTRAVENOUS | Status: AC
  Administered 2019-10-25: 0.25 mg via INTRAVENOUS

## 2019-10-25 MED ORDER — SODIUM CHLORIDE 0.9 % IV SOLN
Freq: Once | INTRAVENOUS | Status: AC
  Filled 2019-10-25: qty 5

## 2019-10-25 NOTE — Progress Notes (Signed)
  William Livingston   Diagnosis: Colon cancer  INTERVAL HISTORY:   William Livingston completed another cycle of CAPOX beginning 10/04/2019.  He had nausea following chemotherapy, but no vomiting.  He reports a "burned "feeling over the tongue.  This has resolved.  He develops "inflammation "at the left arm IV site.  There was prolonged cold sensitivity following chemotherapy.  No neuropathy symptoms at present.  He had one sore at the lower gum.  He has small volume loose stool approximately 4 times per day.  He is exercising and working.  Objective:  Vital signs in last 24 hours:  Blood pressure (!) 133/95, pulse 100, temperature 98.5 F (36.9 C), temperature source Temporal, resp. rate 17, height 5\' 11"  (1.803 m), weight 201 lb 12.8 oz (91.5 kg), SpO2 98 %.    Resp: Lungs clear bilaterally Cardio: Regular rate and rhythm GI: No hepatosplenomegaly, soft Vascular: No leg edema, palpable cord at the distal right forearm, palpable cord at the mid left forearm.  No erythema or swelling. Neuro: The vibratory sense is intact at the fingertips bilaterally Skin: Palms and soles without erythema or skin breakdown    Lab Results:  Lab Results  Component Value Date   WBC 5.5 10/25/2019   HGB 13.0 10/25/2019   HCT 40.0 10/25/2019   MCV 75.3 (L) 10/25/2019   PLT 242 10/25/2019   NEUTROABS 3.4 10/25/2019    CMP  Lab Results  Component Value Date   NA 139 10/25/2019   K 4.1 10/25/2019   CL 103 10/25/2019   CO2 25 10/25/2019   GLUCOSE 101 (H) 10/25/2019   BUN 15 10/25/2019   CREATININE 0.88 10/25/2019   CALCIUM 9.3 10/25/2019   PROT 7.3 10/25/2019   ALBUMIN 4.2 10/25/2019   AST 40 10/25/2019   ALT 51 (H) 10/25/2019   ALKPHOS 100 10/25/2019   BILITOT 0.8 10/25/2019   GFRNONAA >60 10/25/2019   GFRAA >60 10/25/2019    Lab Results  Component Value Date   CEA1 1.7 07/18/2019     Medications: I have reviewed the patient's current  medications.   Assessment/Plan: 1. 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 Grade 3, lymphovascular invasion present tumor invades visceral peritoneum, negative resection margins, 0/33 lymph nodes ? Colonoscopy 07/19/2019-ascending colon mass, polyps removed from the sigmoid colon and rectum-tubular adenoma and hyperplastic polyp, biopsy of ascending colon mass-high-grade poorly differentiated adenocarcinoma ? CT abdomen/pelvis 07/17/2019-inflammatory appearing mass in the mid ascending colon with several prominent surrounding lymph nodes ? CT chest 07/19/2019-no evidence of metastatic disease, 3 mm perifissural nodule in the right lung likely a subpleural lymph node ? Cycle 1 CAPOX 08/23/2019 ? Cycle 2 CAPOX 09/13/2019 (oxaliplatin dose reduced and Emend added) ? Cycle 3 CAPOX 10/04/2019 ? Cycle 4 CAPOX 10/25/2019 (full dose oxaliplatin) 2. Iron deficiency anemia secondary to #1 3. History left clavicle fracture 4. Family history of breast and prostate cancer 5. Vasectomy and vasectomy reversal 6. Mild neutropenia secondary to chemotherapy    Disposition: William Livingston he will complete the final planned cycle of adjuvant chemotherapy today.  The oxaliplatin will be escalated to full dose with this cycle.  He will return for an office and lab visit in approximately 1 month.    Betsy Coder, MD  10/25/2019  12:57 PM

## 2019-10-25 NOTE — Patient Instructions (Signed)
[facility] Discharge Instructions for Patients Receiving Chemotherapy  Today you received the following chemotherapy agents: oxaliplatin.  To help prevent nausea and vomiting after your treatment, we encourage you to take your nausea medication as directed.   If you develop nausea and vomiting that is not controlled by your nausea medication, call the clinic.   BELOW ARE SYMPTOMS THAT SHOULD BE REPORTED IMMEDIATELY:  *FEVER GREATER THAN 100.5 F  *CHILLS WITH OR WITHOUT FEVER  NAUSEA AND VOMITING THAT IS NOT CONTROLLED WITH YOUR NAUSEA MEDICATION  *UNUSUAL SHORTNESS OF BREATH  *UNUSUAL BRUISING OR BLEEDING  TENDERNESS IN MOUTH AND THROAT WITH OR WITHOUT PRESENCE OF ULCERS  *URINARY PROBLEMS  *BOWEL PROBLEMS  UNUSUAL RASH Items with * indicate a potential emergency and should be followed up as soon as possible.  Feel free to call the clinic should you have any questions or concerns. The clinic phone number is [phone].  Please show the CHEMO ALERT CARD at check-in to the Emergency Department and triage nurse.   

## 2019-10-29 ENCOUNTER — Emergency Department (HOSPITAL_COMMUNITY)
Admission: EM | Admit: 2019-10-29 | Discharge: 2019-10-29 | Disposition: A | Discharge status: Home / Self Care | Payer: Managed Care, Other (non HMO) | Attending: Emergency Medicine | Admitting: Emergency Medicine

## 2019-10-29 ENCOUNTER — Encounter (HOSPITAL_COMMUNITY): Payer: Self-pay | Admitting: Emergency Medicine

## 2019-10-29 ENCOUNTER — Emergency Department (HOSPITAL_COMMUNITY): Payer: Managed Care, Other (non HMO)

## 2019-10-29 ENCOUNTER — Other Ambulatory Visit: Payer: Self-pay

## 2019-10-29 ENCOUNTER — Telehealth: Payer: Self-pay | Admitting: *Deleted

## 2019-10-29 DIAGNOSIS — K625 Hemorrhage of anus and rectum: Secondary | ICD-10-CM | POA: Diagnosis not present

## 2019-10-29 DIAGNOSIS — Z9221 Personal history of antineoplastic chemotherapy: Secondary | ICD-10-CM | POA: Insufficient documentation

## 2019-10-29 DIAGNOSIS — C189 Malignant neoplasm of colon, unspecified: Secondary | ICD-10-CM | POA: Insufficient documentation

## 2019-10-29 DIAGNOSIS — R509 Fever, unspecified: Secondary | ICD-10-CM | POA: Insufficient documentation

## 2019-10-29 DIAGNOSIS — Z20822 Contact with and (suspected) exposure to covid-19: Secondary | ICD-10-CM | POA: Insufficient documentation

## 2019-10-29 DIAGNOSIS — Z79899 Other long term (current) drug therapy: Secondary | ICD-10-CM | POA: Diagnosis not present

## 2019-10-29 LAB — CBC WITH DIFFERENTIAL/PLATELET
Abs Immature Granulocytes: 0.04 10*3/uL (ref 0.00–0.07)
Basophils Absolute: 0 10*3/uL (ref 0.0–0.1)
Basophils Relative: 0 %
Eosinophils Absolute: 0 10*3/uL (ref 0.0–0.5)
Eosinophils Relative: 0 %
HCT: 41.1 % (ref 39.0–52.0)
Hemoglobin: 13.2 g/dL (ref 13.0–17.0)
Immature Granulocytes: 1 %
Lymphocytes Relative: 6 %
Lymphs Abs: 0.6 10*3/uL — ABNORMAL LOW (ref 0.7–4.0)
MCH: 25.4 pg — ABNORMAL LOW (ref 26.0–34.0)
MCHC: 32.1 g/dL (ref 30.0–36.0)
MCV: 79 fL — ABNORMAL LOW (ref 80.0–100.0)
Monocytes Absolute: 0.6 10*3/uL (ref 0.1–1.0)
Monocytes Relative: 7 %
Neutro Abs: 7.5 10*3/uL (ref 1.7–7.7)
Neutrophils Relative %: 86 %
Platelets: 200 10*3/uL (ref 150–400)
RBC: 5.2 MIL/uL (ref 4.22–5.81)
RDW: 28.2 % — ABNORMAL HIGH (ref 11.5–15.5)
WBC: 8.7 10*3/uL (ref 4.0–10.5)
nRBC: 0 % (ref 0.0–0.2)

## 2019-10-29 LAB — URINALYSIS, ROUTINE W REFLEX MICROSCOPIC
Bilirubin Urine: NEGATIVE
Glucose, UA: 50 mg/dL — AB
Hgb urine dipstick: NEGATIVE
Ketones, ur: NEGATIVE mg/dL
Leukocytes,Ua: NEGATIVE
Nitrite: NEGATIVE
Protein, ur: NEGATIVE mg/dL
Specific Gravity, Urine: 1.021 (ref 1.005–1.030)
pH: 6 (ref 5.0–8.0)

## 2019-10-29 LAB — LACTIC ACID, PLASMA: Lactic Acid, Venous: 1.6 mmol/L (ref 0.5–1.9)

## 2019-10-29 LAB — COMPREHENSIVE METABOLIC PANEL
ALT: 39 U/L (ref 0–44)
AST: 31 U/L (ref 15–41)
Albumin: 4.2 g/dL (ref 3.5–5.0)
Alkaline Phosphatase: 85 U/L (ref 38–126)
Anion gap: 11 (ref 5–15)
BUN: 13 mg/dL (ref 6–20)
CO2: 23 mmol/L (ref 22–32)
Calcium: 9.1 mg/dL (ref 8.9–10.3)
Chloride: 100 mmol/L (ref 98–111)
Creatinine, Ser: 0.81 mg/dL (ref 0.61–1.24)
GFR calc Af Amer: >60 mL/min (ref >60)
GFR calc non Af Amer: >60 mL/min (ref >60)
Glucose, Bld: 131 mg/dL — ABNORMAL HIGH (ref 70–99)
Potassium: 3.9 mmol/L (ref 3.5–5.1)
Sodium: 134 mmol/L — ABNORMAL LOW (ref 135–145)
Total Bilirubin: 0.9 mg/dL (ref 0.3–1.2)
Total Protein: 7.3 g/dL (ref 6.5–8.1)

## 2019-10-29 LAB — PROTIME-INR
INR: 1.1 (ref 0.8–1.2)
Prothrombin Time: 14.5 seconds (ref 11.4–15.2)

## 2019-10-29 NOTE — Telephone Encounter (Signed)
Reports episode of rectal bleeding today (bright red). Was waiting for wife in car when sudden urge to defecate came--tried to hold it, but had to find a store to have BM. Blood on tissue w/first 2 wipes, then no more. Instructed him to monitor for now and call back if it persists or he gets lightheaded or short of breath. Dr. Benay Spice notified and agreed w/nurses advice.
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
## 2019-10-29 NOTE — Discharge Instructions (Addendum)
You were evaluated in the emergency department for a fever in the setting of colon cancer/chemotherapy.  You had blood work chest x-ray and a urinalysis that did not show any serious findings.  You were also tested for Covid and this test is pending at the time of discharge.  Please contact your oncology clinic tomorrow for close follow-up.  Return if any worsening symptoms.

## 2019-10-29 NOTE — ED Triage Notes (Signed)
Patient reports fever at home. Hx colon cancer. Last treatment on 12/31.

## 2019-10-30 LAB — SARS CORONAVIRUS 2 (TAT 6-24 HRS): SARS Coronavirus 2: NEGATIVE

## 2019-11-04 LAB — CULTURE, BLOOD (ROUTINE X 2)
Culture: NO GROWTH
Special Requests: ADEQUATE

## 2019-11-23 ENCOUNTER — Encounter: Payer: Self-pay | Admitting: Nurse Practitioner

## 2019-11-23 ENCOUNTER — Other Ambulatory Visit: Payer: Self-pay

## 2019-11-23 ENCOUNTER — Inpatient Hospital Stay: Payer: Managed Care, Other (non HMO) | Attending: Oncology | Admitting: Nurse Practitioner

## 2019-11-23 ENCOUNTER — Inpatient Hospital Stay: Payer: Managed Care, Other (non HMO)

## 2019-11-23 VITALS — BP 133/88 | HR 78 | Temp 98.5°F | Resp 20 | Ht 71.0 in | Wt 201.2 lb

## 2019-11-23 DIAGNOSIS — D701 Agranulocytosis secondary to cancer chemotherapy: Secondary | ICD-10-CM | POA: Insufficient documentation

## 2019-11-23 DIAGNOSIS — D630 Anemia in neoplastic disease: Secondary | ICD-10-CM | POA: Insufficient documentation

## 2019-11-23 DIAGNOSIS — C182 Malignant neoplasm of ascending colon: Secondary | ICD-10-CM | POA: Insufficient documentation

## 2019-11-23 LAB — CMP (CANCER CENTER ONLY)
ALT: 86 U/L — ABNORMAL HIGH (ref 0–44)
AST: 60 U/L — ABNORMAL HIGH (ref 15–41)
Albumin: 4.2 g/dL (ref 3.5–5.0)
Alkaline Phosphatase: 113 U/L (ref 38–126)
Anion gap: 10 (ref 5–15)
BUN: 16 mg/dL (ref 6–20)
CO2: 26 mmol/L (ref 22–32)
Calcium: 9 mg/dL (ref 8.9–10.3)
Chloride: 103 mmol/L (ref 98–111)
Creatinine: 1.01 mg/dL (ref 0.61–1.24)
GFR, Est AFR Am: >60 mL/min (ref >60)
GFR, Estimated: >60 mL/min (ref >60)
Glucose, Bld: 98 mg/dL (ref 70–99)
Potassium: 4.3 mmol/L (ref 3.5–5.1)
Sodium: 139 mmol/L (ref 135–145)
Total Bilirubin: 0.9 mg/dL (ref 0.3–1.2)
Total Protein: 7.4 g/dL (ref 6.5–8.1)

## 2019-11-23 LAB — CBC WITH DIFFERENTIAL (CANCER CENTER ONLY)
Abs Immature Granulocytes: 0.01 10*3/uL (ref 0.00–0.07)
Basophils Absolute: 0 10*3/uL (ref 0.0–0.1)
Basophils Relative: 1 %
Eosinophils Absolute: 0.2 10*3/uL (ref 0.0–0.5)
Eosinophils Relative: 5 %
HCT: 41.7 % (ref 39.0–52.0)
Hemoglobin: 13.5 g/dL (ref 13.0–17.0)
Immature Granulocytes: 0 %
Lymphocytes Relative: 35 %
Lymphs Abs: 1.2 10*3/uL (ref 0.7–4.0)
MCH: 27.2 pg (ref 26.0–34.0)
MCHC: 32.4 g/dL (ref 30.0–36.0)
MCV: 83.9 fL (ref 80.0–100.0)
Monocytes Absolute: 0.7 10*3/uL (ref 0.1–1.0)
Monocytes Relative: 20 %
Neutro Abs: 1.3 10*3/uL — ABNORMAL LOW (ref 1.7–7.7)
Neutrophils Relative %: 39 %
Platelet Count: 161 10*3/uL (ref 150–400)
RBC: 4.97 MIL/uL (ref 4.22–5.81)
RDW: 26 % — ABNORMAL HIGH (ref 11.5–15.5)
WBC Count: 3.4 10*3/uL — ABNORMAL LOW (ref 4.0–10.5)
nRBC: 0 % (ref 0.0–0.2)

## 2019-11-23 LAB — CEA (IN HOUSE-CHCC): CEA (CHCC-In House): 1.43 ng/mL (ref 0.00–5.00)

## 2019-11-23 NOTE — Progress Notes (Addendum)
  [facility] OFFICE PROGRESS NOTE   Diagnosis: Colon cancer  INTERVAL HISTORY:   Mr. Aujla returns as scheduled.  He completed the fourth and final cycle of adjuvant CAPOX beginning 10/25/2019. He denies significant neuropathy symptoms. He notes improvement in bowel habits overall. Daily for the past several weeks, usually with the first bowel movement, he notes a small amount of bright red blood on the toilet tissue.  He was seen in the emergency department on 10/29/2019 with a fever. Covid test was negative. Cultures negative. Fever and "achiness" resolved over several days.  Objective:  Vital signs in last 24 hours:  Blood pressure 133/88, pulse 78, temperature 98.5 F (36.9 C), temperature source Temporal, resp. rate 20, height 5\' 11"  (1.803 m), weight 201 lb 3.2 oz (91.3 kg), SpO2 97 %.    HEENT: Neck without mass. Lymphatics: No palpable cervical, supraclavicular, axillary or inguinal lymph nodes. GI: Abdomen soft and nontender.  No hepatomegaly. Vascular: No leg edema.  Skin: Palms without erythema.   Lab Results:  Lab Results  Component Value Date   WBC 3.4 (L) 11/23/2019   HGB 13.5 11/23/2019   HCT 41.7 11/23/2019   MCV 83.9 11/23/2019   PLT 161 11/23/2019   NEUTROABS 1.3 (L) 11/23/2019    Imaging:  No results found.  Medications: I have reviewed the patient's current medications.  Assessment/Plan: 1. 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 Grade 3, lymphovascular invasion present tumor invades visceral peritoneum, negative resection margins, 0/33 lymph nodes ? Colonoscopy 07/19/2019-ascending colon mass, polyps removed from the sigmoid colon and rectum-tubular adenoma and hyperplastic polyp, biopsy of ascending colon mass-high-grade poorly differentiated adenocarcinoma ? CT abdomen/pelvis 07/17/2019-inflammatory appearing mass in the mid ascending colon with several prominent surrounding  lymph nodes ? CT chest 07/19/2019-no evidence of metastatic disease, 3 mm perifissural nodule in the right lung likely a subpleural lymph node ? Cycle 1 CAPOX 08/23/2019 ? Cycle 2 CAPOX 09/13/2019 (oxaliplatin dose reduced and Emend added) ? Cycle 3 CAPOX 10/04/2019 ? Cycle 4 CAPOX 10/25/2019 (full dose oxaliplatin) 2. Iron deficiency anemia secondary to #1 3. History left clavicle fracture 4. Family history of breast and prostate cancer 5. Vasectomy and vasectomy reversal 6. Mild neutropenia secondary to chemotherapy  Disposition: Mr. Cloutier appears stable.  He completed fourth and final cycle of adjuvant CAPOX on 11/07/2019.  We will follow-up on the CEA from today.  We reviewed the CBC from today.  He has mild neutropenia.  This is likely still an effect of the chemotherapy.  He understands to contact the office with fever, chills, other signs of infection.  We reviewed the chemistry panel.  Transaminases are mildly elevated also likely related to the chemotherapy.  We will repeat labs at the time of his next appointment.  With regard to the rectal bleeding he will follow-up with Dr. Alessandra Bevels.  He will return for lab and follow-up in 3 months.  He will contact the office in the interim as outlined above or with any other problems.  Patient seen with Dr. Benay Spice.    Ned Card ANP/GNP-BC   11/23/2019  11:31 AM This was a shared visit with Ned Card.  Mr. Bobb has completed the course of adjuvant chemotherapy.  He will follow-up with gastroenterology to evaluate rectal bleeding.  Julieanne Manson, MD

## 2020-02-21 ENCOUNTER — Inpatient Hospital Stay: Payer: Self-pay | Admitting: Oncology

## 2020-02-21 ENCOUNTER — Inpatient Hospital Stay: Payer: Self-pay

## 2020-02-21 ENCOUNTER — Other Ambulatory Visit: Payer: Self-pay

## 2020-03-25 ENCOUNTER — Other Ambulatory Visit: Payer: Self-pay

## 2020-03-25 ENCOUNTER — Inpatient Hospital Stay (HOSPITAL_BASED_OUTPATIENT_CLINIC_OR_DEPARTMENT_OTHER): Payer: 59 | Admitting: Oncology

## 2020-03-25 ENCOUNTER — Inpatient Hospital Stay: Payer: 59 | Attending: Oncology

## 2020-03-25 VITALS — BP 126/73 | HR 53 | Temp 98.0°F | Resp 18 | Ht 71.0 in | Wt 209.5 lb

## 2020-03-25 DIAGNOSIS — C182 Malignant neoplasm of ascending colon: Secondary | ICD-10-CM

## 2020-03-25 DIAGNOSIS — Z85038 Personal history of other malignant neoplasm of large intestine: Secondary | ICD-10-CM | POA: Insufficient documentation

## 2020-03-25 LAB — CBC WITH DIFFERENTIAL (CANCER CENTER ONLY)
Abs Immature Granulocytes: 0.01 10*3/uL (ref 0.00–0.07)
Basophils Absolute: 0 10*3/uL (ref 0.0–0.1)
Basophils Relative: 1 %
Eosinophils Absolute: 0.2 10*3/uL (ref 0.0–0.5)
Eosinophils Relative: 4 %
HCT: 43.4 % (ref 39.0–52.0)
Hemoglobin: 14 g/dL (ref 13.0–17.0)
Immature Granulocytes: 0 %
Lymphocytes Relative: 28 %
Lymphs Abs: 1.2 10*3/uL (ref 0.7–4.0)
MCH: 27.6 pg (ref 26.0–34.0)
MCHC: 32.3 g/dL (ref 30.0–36.0)
MCV: 85.4 fL (ref 80.0–100.0)
Monocytes Absolute: 0.6 10*3/uL (ref 0.1–1.0)
Monocytes Relative: 14 %
Neutro Abs: 2.3 10*3/uL (ref 1.7–7.7)
Neutrophils Relative %: 53 %
Platelet Count: 228 10*3/uL (ref 150–400)
RBC: 5.08 MIL/uL (ref 4.22–5.81)
RDW: 13.8 % (ref 11.5–15.5)
WBC Count: 4.3 10*3/uL (ref 4.0–10.5)
nRBC: 0 % (ref 0.0–0.2)

## 2020-03-25 LAB — CMP (CANCER CENTER ONLY)
ALT: 18 U/L (ref 0–44)
AST: 20 U/L (ref 15–41)
Albumin: 3.9 g/dL (ref 3.5–5.0)
Alkaline Phosphatase: 92 U/L (ref 38–126)
Anion gap: 10 (ref 5–15)
BUN: 12 mg/dL (ref 6–20)
CO2: 26 mmol/L (ref 22–32)
Calcium: 9.2 mg/dL (ref 8.9–10.3)
Chloride: 103 mmol/L (ref 98–111)
Creatinine: 0.93 mg/dL (ref 0.61–1.24)
GFR, Est AFR Am: >60 mL/min (ref >60)
GFR, Estimated: >60 mL/min (ref >60)
Glucose, Bld: 93 mg/dL (ref 70–99)
Potassium: 4.6 mmol/L (ref 3.5–5.1)
Sodium: 139 mmol/L (ref 135–145)
Total Bilirubin: 0.7 mg/dL (ref 0.3–1.2)
Total Protein: 7 g/dL (ref 6.5–8.1)

## 2020-03-25 MED ORDER — ALTEPLASE 2 MG IJ SOLR
INTRAMUSCULAR | Status: AC
  Filled 2020-03-25: qty 2

## 2020-03-25 NOTE — Progress Notes (Signed)
  [facility] OFFICE PROGRESS NOTE   Diagnosis: Colon cancer  INTERVAL HISTORY:   William Livingston returns as scheduled.  He feels well.  He is exercising.  He has a mild incisional hernia.  He has intermittent mild numbness in the toes.  This does not interfere with activity.  No difficulty with bowel function.  Objective:  Vital signs in last 24 hours:  Blood pressure 126/73, pulse (!) 53, temperature 98 F (36.7 C), temperature source Temporal, resp. rate 18, height 5\' 11"  (1.803 m), weight 209 lb 8 oz (95 kg), SpO2 99 %.   Lymphatics: No cervical, supraclavicular, axillary, or inguinal nodes Resp: Lungs clear bilaterally Cardio: Regular rate and rhythm GI: No hepatosplenomegaly, no mass, nontender, mild left paraincisional hernia Vascular: No leg edema   Lab Results:  Lab Results  Component Value Date   WBC 4.3 03/25/2020   HGB 14.0 03/25/2020   HCT 43.4 03/25/2020   MCV 85.4 03/25/2020   PLT 228 03/25/2020   NEUTROABS 2.3 03/25/2020    CMP  Lab Results  Component Value Date   NA 139 03/25/2020   K 4.6 03/25/2020   CL 103 03/25/2020   CO2 26 03/25/2020   GLUCOSE 93 03/25/2020   BUN 12 03/25/2020   CREATININE 0.93 03/25/2020   CALCIUM 9.2 03/25/2020   PROT 7.0 03/25/2020   ALBUMIN 3.9 03/25/2020   AST 20 03/25/2020   ALT 18 03/25/2020   ALKPHOS 92 03/25/2020   BILITOT 0.7 03/25/2020   GFRNONAA >60 03/25/2020   GFRAA >60 03/25/2020    Lab Results  Component Value Date   CEA1 1.43 11/23/2019     Medications: I have reviewed the patient's current medications.   Assessment/Plan: 1. 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 Grade 3, lymphovascular invasion present tumor invades visceral peritoneum, negative resection margins, 0/33 lymph nodes ? Colonoscopy 07/19/2019-ascending colon mass, polyps removed from the sigmoid colon and rectum-tubular adenoma and hyperplastic polyp, biopsy of  ascending colon mass-high-grade poorly differentiated adenocarcinoma ? CT abdomen/pelvis 07/17/2019-inflammatory appearing mass in the mid ascending colon with several prominent surrounding lymph nodes ? CT chest 07/19/2019-no evidence of metastatic disease, 3 mm perifissural nodule in the right lung likely a subpleural lymph node ? Cycle 1 CAPOX 08/23/2019 ? Cycle 2 CAPOX 09/13/2019 (oxaliplatin dose reduced and Emend added) ? Cycle 3 CAPOX 10/04/2019 ? Cycle 4 CAPOX 10/25/2019 (full dose oxaliplatin) 2. History of iron deficiency anemia secondary to #1, resolved 3. History left clavicle fracture 4. Family history of breast and prostate cancer 5. Vasectomy and vasectomy reversal 6. Mild neutropenia secondary to chemotherapy-resolved    Disposition: William Livingston is in clinical remission from colon cancer.  We will follow up on the CEA from today.  He will return for an office visit and restaging CTs in 6 months.  He will be due for a surveillance colonoscopy later this year.  Betsy Coder, MD  03/25/2020  12:44 PM

## 2020-03-26 LAB — CEA (IN HOUSE-CHCC): CEA (CHCC-In House): 1.42 ng/mL (ref 0.00–5.00)

## 2020-03-28 ENCOUNTER — Telehealth: Payer: Self-pay

## 2020-03-28 NOTE — Telephone Encounter (Signed)
Left voicemail for patient to call back [facility] 

## 2020-03-28 NOTE — Telephone Encounter (Signed)
TC to pt per Ned Card NP to let him know that his CEA is stable in normal range and toFollow-up as scheduled. Patient verbalized understanding.

## 2020-05-06 ENCOUNTER — Ambulatory Visit: Payer: Self-pay | Admitting: Surgery

## 2020-05-06 NOTE — H&P (Signed)
[name] Appointment: 05/06/2020 10:10 AM Location: [facility] Patient #: [id number] DOB: 03-19-1981 Married / Language: William Livingston / Race: White Male  History of Present Illness William Livingston; 05/06/2020 12:48 PM) Patient words: Patient returns for follow-up after undergoing right colectomy last year for colon cancer. He is doing well and is disease-free year out. He developed a bulge along his incision. He's been training very heavily with weight training cycling. The bulge is not painful but he wanted to get checked out. An present for multiple months and is stable in size. No change to his bowel or bladder function. No blood in his stool.  The patient is a 39 year old male.   Allergies (Tanisha A. Owens Shark, RMA; 05/06/2020 10:07 AM) Amoxicillin *PENICILLINS* Hives. Allergies Reconciled  Medication History (Tanisha A. Owens Shark, [place]; 05/06/2020 10:07 AM) No Current Medications Medications Reconciled    Vitals (Tanisha A. Brown RMA; 05/06/2020 10:07 AM) 05/06/2020 10:07 AM Weight: 200.4 lb Height: 70in Body Surface Area: 2.09 m Body Mass Index: 28.75 kg/m  Temp.: 98.63F  Pulse: 68 (Regular)  BP: 124/82(Sitting, Left Arm, Standard)        Physical Exam ([name]; 05/06/2020 12:48 PM)  General Mental Status-Alert. General Appearance-Consistent with stated age. Hydration-Well hydrated. Voice-Normal.  Chest and Lung Exam Chest and lung exam reveals -quiet, even and easy respiratory effort with no use of accessory muscles and on auscultation, normal breath sounds, no adventitious sounds and normal vocal resonance. Inspection Chest Wall - Normal. Back - normal.  Cardiovascular Cardiovascular examination reveals -normal heart sounds, regular rate and rhythm with no murmurs and normal pedal pulses bilaterally.  Abdomen Note: Upper midline scar noted. 3 cm reducible bulge left lateral aspect of the incision  consistent with incisional hernia which is not incarcerated.    Assessment & Plan ([name]; 05/06/2020 12:49 PM)  S/P RIGHT COLECTOMY (Z90.49) Impression: Continue follow-up with oncology.   INCISIONAL HERNIA, WITHOUT OBSTRUCTION OR GANGRENE (K43.2) Impression: CT scan to evaluate incisional hernia for consideration of surgical approach  Discussed of this lessened 3 cm he may be amendable to an open approach versus laparoscopic approach both with mesh. This may require further workup as well. It is small enough I think an open approach can be done but obviously I think a laparoscopic approach may be his best to underlie the entire incision given his increased activity level and that is level.  Current Plans You are being scheduled for surgery- Our schedulers will call you.  You should hear from our office's scheduling department within 5 working days about the location, date, and time of surgery. We try to make accommodations for patient's preferences in scheduling surgery, but sometimes the OR schedule or the surgeon's schedule prevents [place] from making those accommodations.  If you have not heard from our office [phone]) in 5 working days, call the office and ask for your surgeon's nurse.  If you have other questions about your diagnosis, plan, or surgery, call the office and ask for your surgeon's nurse.  Pt Education - Pamphlet Given - Hernia Surgery: discussed with patient and provided information. [facility] Consent - Hernia Repair - Ventral/Incisional/Umbilical (Gross): discussed with patient and provided information. Pt Education - [facility] Mesh education: discussed with patient and provided information.

## 2020-05-09 ENCOUNTER — Other Ambulatory Visit: Payer: Self-pay | Admitting: Surgery

## 2020-05-09 DIAGNOSIS — K432 Incisional hernia without obstruction or gangrene: Secondary | ICD-10-CM

## 2020-05-30 ENCOUNTER — Ambulatory Visit
Admission: RE | Admit: 2020-05-30 | Discharge: 2020-05-30 | Disposition: A | Discharge status: Home / Self Care | Payer: 59 | Source: Ambulatory Visit | Attending: Surgery | Admitting: Surgery

## 2020-05-30 DIAGNOSIS — K432 Incisional hernia without obstruction or gangrene: Secondary | ICD-10-CM

## 2020-05-30 MED ORDER — IOPAMIDOL (ISOVUE-300) INJECTION 61%
100.0000 mL | Freq: Once | INTRAVENOUS | Status: AC | PRN
  Administered 2020-05-30: 100 mL via INTRAVENOUS

## 2020-07-21 ENCOUNTER — Inpatient Hospital Stay: Payer: 59 | Attending: Oncology

## 2020-07-21 ENCOUNTER — Other Ambulatory Visit: Payer: Self-pay

## 2020-07-21 DIAGNOSIS — C182 Malignant neoplasm of ascending colon: Secondary | ICD-10-CM

## 2020-07-21 DIAGNOSIS — Z85038 Personal history of other malignant neoplasm of large intestine: Secondary | ICD-10-CM | POA: Diagnosis present

## 2020-07-21 DIAGNOSIS — Z23 Encounter for immunization: Secondary | ICD-10-CM | POA: Diagnosis not present

## 2020-07-21 LAB — BASIC METABOLIC PANEL - CANCER CENTER ONLY
Anion gap: 5 (ref 5–15)
BUN: 13 mg/dL (ref 6–20)
CO2: 28 mmol/L (ref 22–32)
Calcium: 9.3 mg/dL (ref 8.9–10.3)
Chloride: 104 mmol/L (ref 98–111)
Creatinine: 0.81 mg/dL (ref 0.61–1.24)
GFR, Est AFR Am: >60 mL/min (ref >60)
GFR, Estimated: >60 mL/min (ref >60)
Glucose, Bld: 100 mg/dL — ABNORMAL HIGH (ref 70–99)
Potassium: 4.4 mmol/L (ref 3.5–5.1)
Sodium: 137 mmol/L (ref 135–145)

## 2020-07-21 LAB — CEA (IN HOUSE-CHCC): CEA (CHCC-In House): 1.1 ng/mL (ref 0.00–5.00)

## 2020-07-24 ENCOUNTER — Other Ambulatory Visit: Payer: Self-pay

## 2020-07-24 ENCOUNTER — Inpatient Hospital Stay (HOSPITAL_BASED_OUTPATIENT_CLINIC_OR_DEPARTMENT_OTHER): Payer: 59 | Admitting: Oncology

## 2020-07-24 ENCOUNTER — Telehealth: Payer: Self-pay | Admitting: Oncology

## 2020-07-24 VITALS — BP 126/67 | HR 58 | Temp 97.1°F | Resp 18 | Ht 71.0 in | Wt 202.4 lb

## 2020-07-24 DIAGNOSIS — C182 Malignant neoplasm of ascending colon: Secondary | ICD-10-CM | POA: Diagnosis not present

## 2020-07-24 DIAGNOSIS — Z23 Encounter for immunization: Secondary | ICD-10-CM

## 2020-07-24 DIAGNOSIS — Z85038 Personal history of other malignant neoplasm of large intestine: Secondary | ICD-10-CM | POA: Diagnosis not present

## 2020-07-24 MED ORDER — INFLUENZA VAC SPLIT QUAD 0.5 ML IM SUSY
0.5000 mL | PREFILLED_SYRINGE | Freq: Once | INTRAMUSCULAR | Status: AC
  Administered 2020-07-24: 0.5 mL via INTRAMUSCULAR

## 2020-07-24 MED ORDER — INFLUENZA VAC SPLIT QUAD 0.5 ML IM SUSY
PREFILLED_SYRINGE | INTRAMUSCULAR | Status: AC
  Filled 2020-07-24: qty 0.5

## 2020-07-24 NOTE — Telephone Encounter (Signed)
Scheduled appointment per 9/30 los. Spoke to patient who is aware of upcoming appointments.

## 2020-07-24 NOTE — Progress Notes (Signed)
  [facility] OFFICE PROGRESS NOTE   Diagnosis: Colon cancer  INTERVAL HISTORY:   William Livingston returns as scheduled.  He feels well.  No difficulty with bowel function.  No neuropathy symptoms.  He is exercising.  He is being followed by Dr. Brantley Stage for an abdominal hernia.  A CT of the abdomen and pelvis on 05/30/2020 revealed no evidence of recurrent colon cancer.  A small ventral hernia was noted.  Objective:  Vital signs in last 24 hours:  Blood pressure 126/67, pulse (!) 58, temperature (!) 97.1 F (36.2 C), temperature source Tympanic, resp. rate 18, height 5\' 11"  (1.803 m), weight 202 lb 6.4 oz (91.8 kg), SpO2 98 %.    Lymphatics: No cervical, supraclavicular, axillary, or inguinal nodes Resp: Lungs clear bilaterally Cardio: Regular rate and rhythm GI: No hepatosplenomegaly, no mass, nontender Vascular: No leg edema   Lab Results:  Lab Results  Component Value Date   WBC 4.3 03/25/2020   HGB 14.0 03/25/2020   HCT 43.4 03/25/2020   MCV 85.4 03/25/2020   PLT 228 03/25/2020   NEUTROABS 2.3 03/25/2020    CMP  Lab Results  Component Value Date   NA 137 07/21/2020   K 4.4 07/21/2020   CL 104 07/21/2020   CO2 28 07/21/2020   GLUCOSE 100 (H) 07/21/2020   BUN 13 07/21/2020   CREATININE 0.81 07/21/2020   CALCIUM 9.3 07/21/2020   PROT 7.0 03/25/2020   ALBUMIN 3.9 03/25/2020   AST 20 03/25/2020   ALT 18 03/25/2020   ALKPHOS 92 03/25/2020   BILITOT 0.7 03/25/2020   GFRNONAA >60 07/21/2020   GFRAA >60 07/21/2020    Lab Results  Component Value Date   CEA1 1.10 07/21/2020     Medications: I have reviewed the patient's current medications.   Assessment/Plan: 1. 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 Grade 3, lymphovascular invasion present tumor invades visceral peritoneum, negative resection margins, 0/33 lymph nodes ? Colonoscopy 07/19/2019-ascending colon mass, polyps removed from the  sigmoid colon and rectum-tubular adenoma and hyperplastic polyp, biopsy of ascending colon mass-high-grade poorly differentiated adenocarcinoma ? CT abdomen/pelvis 07/17/2019-inflammatory appearing mass in the mid ascending colon with several prominent surrounding lymph nodes ? CT chest 07/19/2019-no evidence of metastatic disease, 3 mm perifissural nodule in the right lung likely a subpleural lymph node ? Cycle 1 CAPOX 08/23/2019 ? Cycle 2 CAPOX 09/13/2019 (oxaliplatin dose reduced and Emend added) ? Cycle 3 CAPOX 10/04/2019 ? Cycle 4 CAPOX 10/25/2019 (full dose oxaliplatin) ? CT abdomen/pelvis 05/30/2020-negative for recurrent disease 2. History of iron deficiency anemia secondary to #1, resolved 3. History left clavicle fracture 4. Family history of breast and prostate cancer 5. Vasectomy and vasectomy reversal 6. Mild neutropenia secondary to chemotherapy-resolved     Disposition: Mr. William Livingston is in clinical remission from colon cancer.  He is followed by Dr. Brantley Stage for a ventral hernia.  He had a CT abdomen/pelvis last month.  He will be scheduled for a surveillance chest CT within the next few weeks.  He will return for an office visit and CEA in 6 months.  He reports that he has undergone a surveillance colonoscopy with Dr. Alessandra Bevels.  We will follow up on this report.   William Coder, MD  07/24/2020  10:13 AM

## 2020-08-06 ENCOUNTER — Ambulatory Visit
Admission: RE | Admit: 2020-08-06 | Discharge: 2020-08-06 | Disposition: A | Discharge status: Home / Self Care | Payer: 59 | Source: Ambulatory Visit | Attending: Oncology | Admitting: Oncology

## 2020-08-06 DIAGNOSIS — C182 Malignant neoplasm of ascending colon: Secondary | ICD-10-CM

## 2020-08-06 MED ORDER — IOPAMIDOL (ISOVUE-300) INJECTION 61%
100.0000 mL | Freq: Once | INTRAVENOUS | Status: AC | PRN
  Administered 2020-08-06: 100 mL via INTRAVENOUS

## 2021-01-21 ENCOUNTER — Inpatient Hospital Stay: Payer: Managed Care, Other (non HMO) | Attending: Family Medicine

## 2021-01-21 ENCOUNTER — Inpatient Hospital Stay: Payer: Managed Care, Other (non HMO) | Admitting: Nurse Practitioner

## 2021-03-25 ENCOUNTER — Other Ambulatory Visit: Payer: Self-pay

## 2021-03-25 ENCOUNTER — Encounter: Payer: Self-pay | Admitting: Nurse Practitioner

## 2021-03-25 ENCOUNTER — Inpatient Hospital Stay (HOSPITAL_BASED_OUTPATIENT_CLINIC_OR_DEPARTMENT_OTHER): Payer: Managed Care, Other (non HMO) | Admitting: Nurse Practitioner

## 2021-03-25 ENCOUNTER — Inpatient Hospital Stay: Payer: Managed Care, Other (non HMO) | Attending: Family Medicine

## 2021-03-25 VITALS — BP 125/80 | HR 83 | Temp 98.0°F | Resp 18

## 2021-03-25 DIAGNOSIS — Z9221 Personal history of antineoplastic chemotherapy: Secondary | ICD-10-CM | POA: Insufficient documentation

## 2021-03-25 DIAGNOSIS — C182 Malignant neoplasm of ascending colon: Secondary | ICD-10-CM

## 2021-03-25 DIAGNOSIS — Z803 Family history of malignant neoplasm of breast: Secondary | ICD-10-CM | POA: Diagnosis not present

## 2021-03-25 DIAGNOSIS — Z8042 Family history of malignant neoplasm of prostate: Secondary | ICD-10-CM | POA: Insufficient documentation

## 2021-03-25 DIAGNOSIS — Z85038 Personal history of other malignant neoplasm of large intestine: Secondary | ICD-10-CM | POA: Diagnosis present

## 2021-03-25 LAB — CEA (IN HOUSE-CHCC): CEA (CHCC-In House): 1.28 ng/mL (ref 0.00–5.00)

## 2021-03-25 LAB — CEA (ACCESS): CEA (CHCC): 1.37 ng/mL (ref 0.00–5.00)

## 2021-03-25 NOTE — Progress Notes (Signed)
  [facility] OFFICE PROGRESS NOTE   Diagnosis: Colon cancer  INTERVAL HISTORY:   William Livingston returns for follow-up.  He was last seen 07/24/2020.  No change in bowel habits.  Bowels moving regularly.  No blood or pain with bowel movements.  He had a "stomach bug" last week, mainly experienced nausea.  The nausea resolved over a few days.  He remains very active.  He thinks he is up-to-date on surveillance colonoscopy.  Objective:  Vital signs in last 24 hours:  Blood pressure 125/80, pulse 83, temperature 98 F (36.7 C), temperature source Temporal, resp. rate 18, SpO2 97 %.    HEENT: Neck without mass. Lymphatics: No palpable cervical, supraclavicular or axillary lymph nodes.  Shotty bilateral inguinal lymph nodes. Resp: Lungs clear bilaterally. Cardio: Regular rate and rhythm. GI: Abdomen soft and nontender.  No hepatomegaly.  No mass. Vascular: No leg edema.     Lab Results:  Lab Results  Component Value Date   WBC 4.3 03/25/2020   HGB 14.0 03/25/2020   HCT 43.4 03/25/2020   MCV 85.4 03/25/2020   PLT 228 03/25/2020   NEUTROABS 2.3 03/25/2020    Imaging:  No results found.  Medications: I have reviewed the patient's current medications.  Assessment/Plan: 1. 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 Grade 3, lymphovascular invasion present tumor invades visceral peritoneum, negative resection margins, 0/33 lymph nodes ? Colonoscopy 07/19/2019-ascending colon mass, polyps removed from the sigmoid colon and rectum-tubular adenoma and hyperplastic polyp, biopsy of ascending colon mass-high-grade poorly differentiated adenocarcinoma ? CT abdomen/pelvis 07/17/2019-inflammatory appearing mass in the mid ascending colon with several prominent surrounding lymph nodes ? CT chest 07/19/2019-no evidence of metastatic disease, 3 mm perifissural nodule in the right lung likely a subpleural lymph node ? Cycle 1  CAPOX 08/23/2019 ? Cycle 2 CAPOX 09/13/2019 (oxaliplatin dose reduced and Emend added) ? Cycle 3 CAPOX 10/04/2019 ? Cycle 4 CAPOX 10/25/2019 (full dose oxaliplatin) ? CT abdomen/pelvis 05/30/2020-negative for recurrent disease ? CTs chest/abdomen/pelvis 08/06/2020- no metastatic disease 2. History of iron deficiency anemia secondary to #1, resolved 3. History left clavicle fracture 4. Family history of breast and prostate cancer 5. Vasectomy and vasectomy reversal 6. Mild neutropenia secondary to chemotherapy-resolved   Disposition: [name] remains in clinical remission from colon cancer.  We will follow-up on the CEA from today.  Plan for surveillance CT scans in approximately 4 months (1 year interval from previous CTs).  We will see him in follow-up a few days later to review the results.  He will contact the office in the interim with any problems.  We are contacting Dr. Leanna Sato office to determine timing of next colonoscopy.    Ned Card ANP/GNP-BC   03/25/2021  10:46 AM

## 2021-03-26 ENCOUNTER — Telehealth: Payer: Self-pay

## 2021-03-26 NOTE — Telephone Encounter (Signed)
Per provider called to inquire about colonoscopy learned pt not due x 3 years office will contact to schedule when its time provider and pt aware

## 2021-03-26 NOTE — Telephone Encounter (Signed)
-----   Message from Owens Shark, NP sent at 03/25/2021 11:09 AM EDT ----- Please call Dr Melonie Florida office, [facility], ? When is next colonoscopy due? Let me and patient know.

## 2021-04-12 ENCOUNTER — Encounter: Payer: Self-pay | Admitting: Oncology

## 2021-04-13 ENCOUNTER — Encounter: Payer: Self-pay | Admitting: Oncology

## 2021-04-14 ENCOUNTER — Encounter: Payer: Self-pay | Admitting: Oncology

## 2021-05-09 IMAGING — CT CT ABD-PELV W/ CM
2 of 5 series · 14 of 46 positions shown, 16 images · IV contrast (iopamidol)
Comparison: Most recent CT abdomen and pelvis 05/30/2020. CT chest
07/19/2019.

CLINICAL DATA: Primary Cancer Type: Colon
TECHNIQUE: Multidetector CT imaging of the chest, abdomen and pelvis was
performed following the standard protocol during bolus
administration of intravenous contrast.

CONTRAST:  100mL 0AFBLD-7RR IOPAMIDOL (0AFBLD-7RR) INJECTION 61%

[Series 2: cap with 5.00 br40 s3 axial · axial · 0.83mm/px · z∈[+1301,+1881]mm · 11 of 140 slices shown, 13 images]
[im 12/140  soft-tissue]
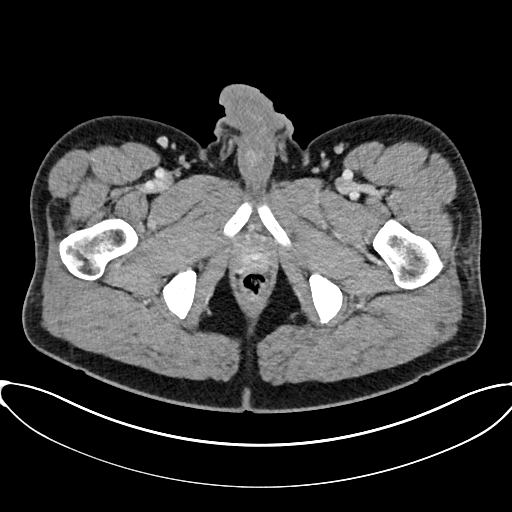
[im 12/140  bone]
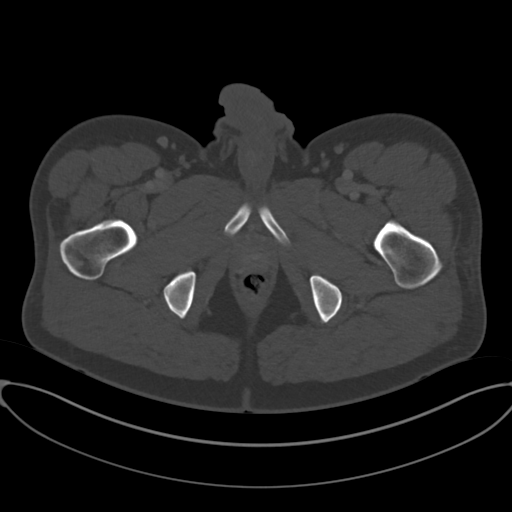
[im 24/140  soft-tissue]
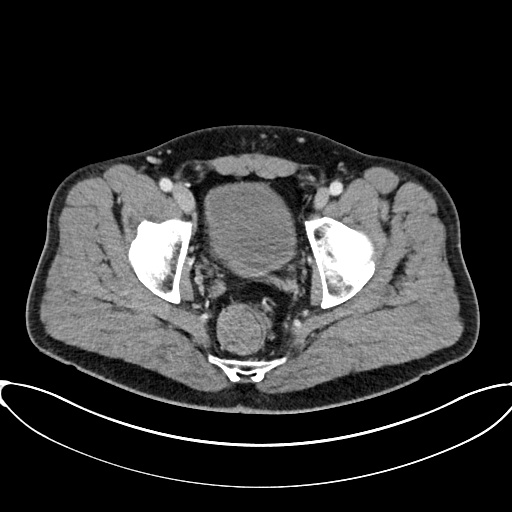
[im 35/140  soft-tissue]
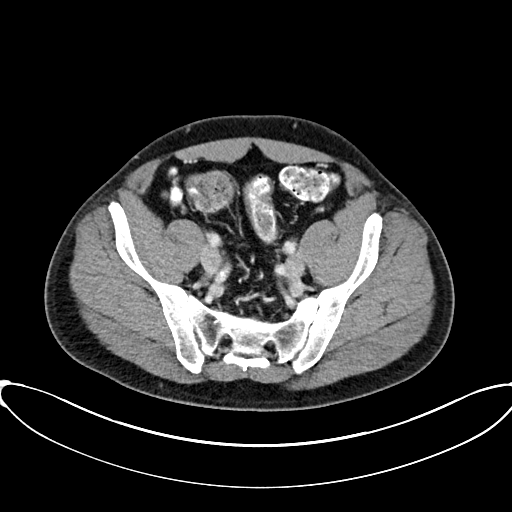
[im 47/140  soft-tissue]
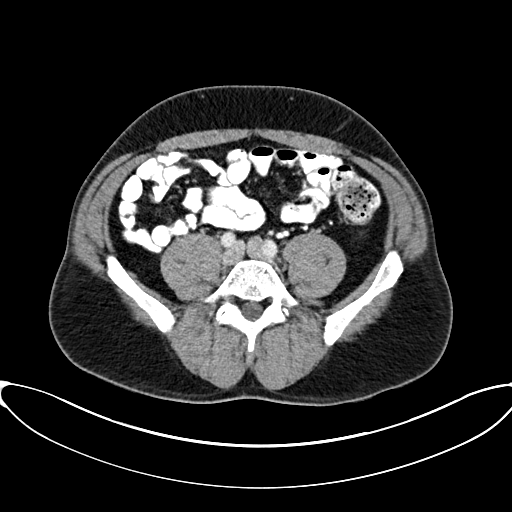
[im 58/140  soft-tissue]
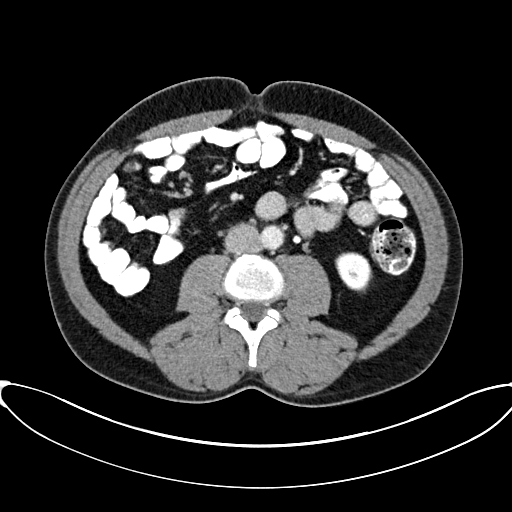
[im 70/140  soft-tissue]
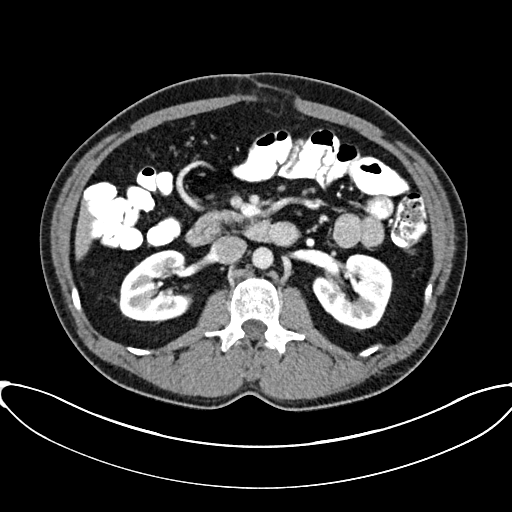
[im 82/140  soft-tissue]
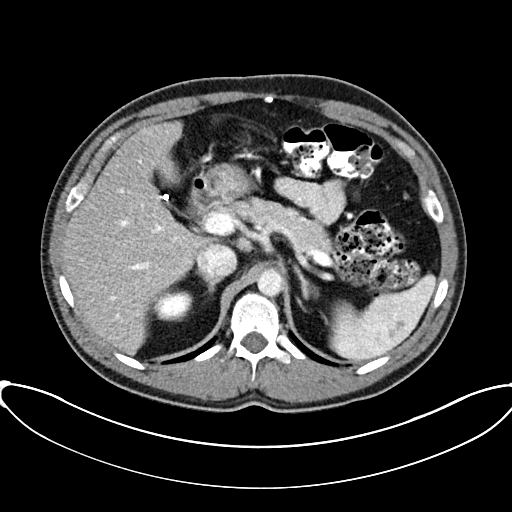
[im 93/140  soft-tissue]
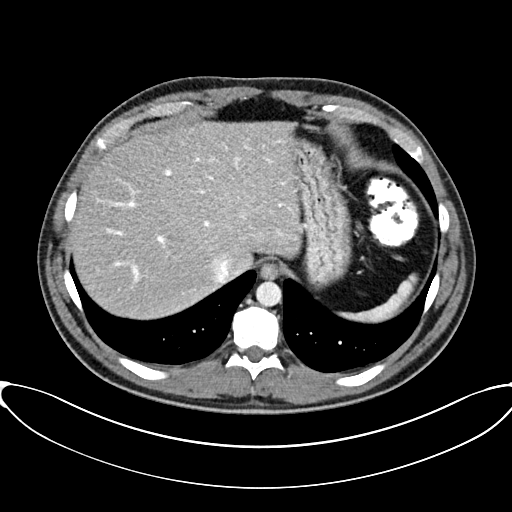
[im 105/140  soft-tissue]
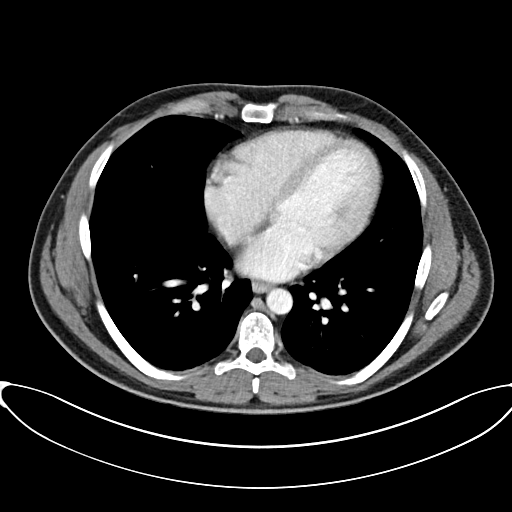
[im 105/140  bone]
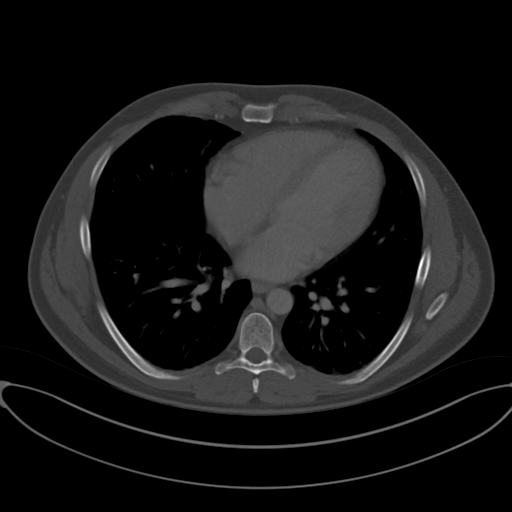
[im 116/140  soft-tissue]
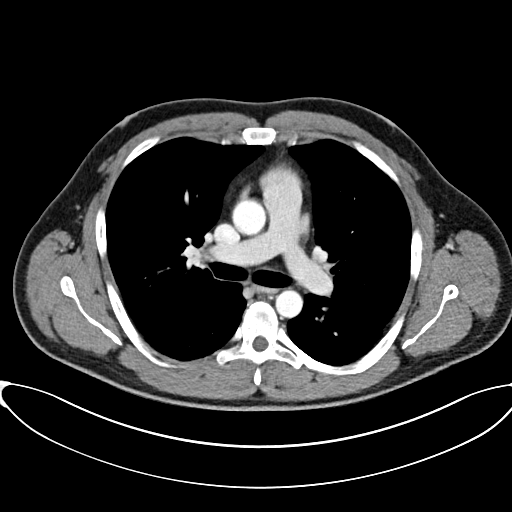
[im 128/140  soft-tissue]
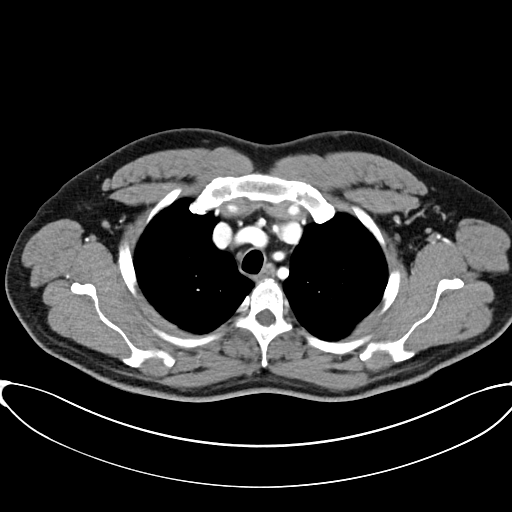

[Series 6: cap with 2.00 br40 s3 cor · coronal · 0.83mm/px · 3 of 211 slices shown]
[im 71/211  soft-tissue]
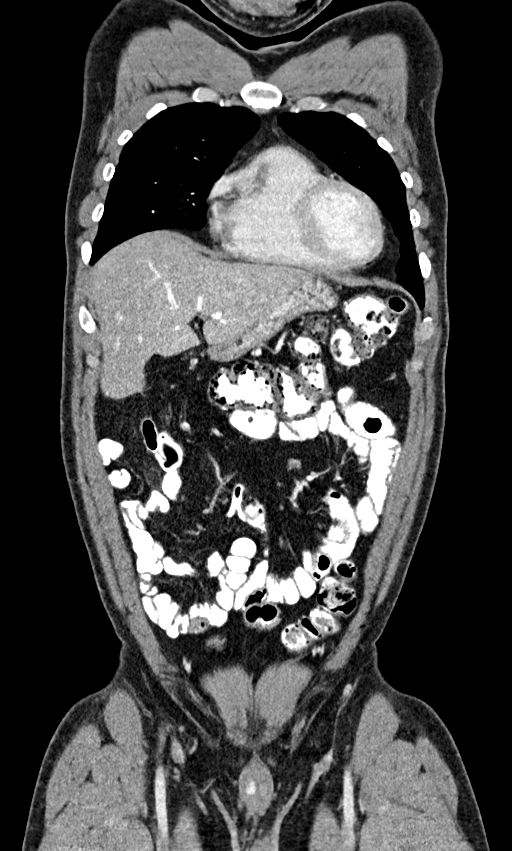
[im 94/211  soft-tissue]
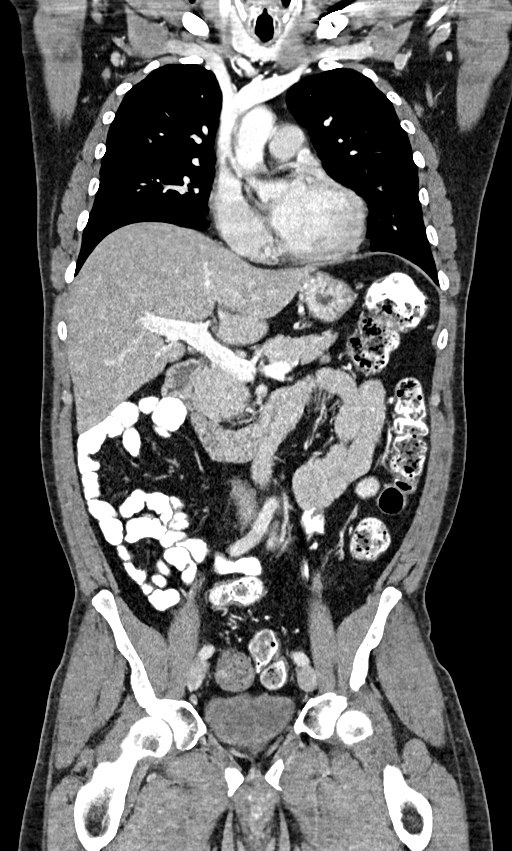
[im 117/211  soft-tissue]
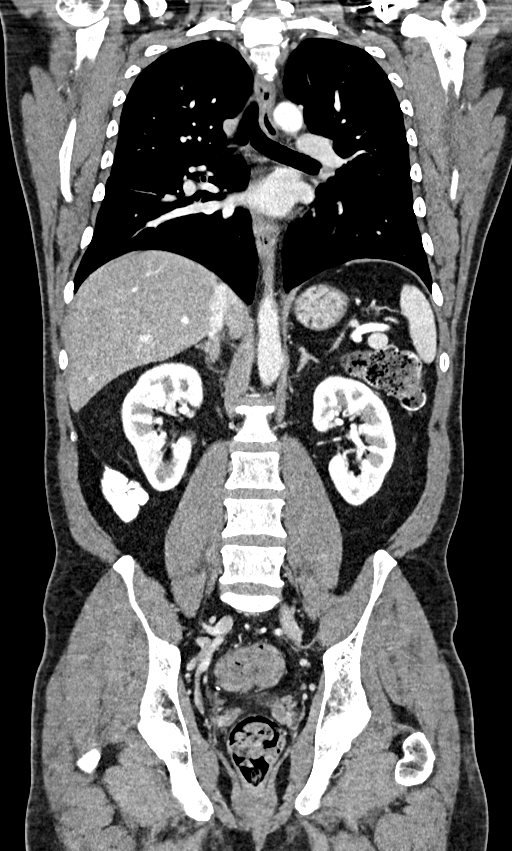

[14 of 46 positions shown; findings below may reference images not displayed]

Imaging Indication: Routine surveillance

Interval therapy since last imaging? No

Initial Cancer Diagnosis

Date: 07/23/2019; Established by: Biopsy-proven

Detailed Pathology: Adenocarcinoma of the ascending colon, stage
IIb.

Primary Tumor location: Colon ascending at hepatic flexure

Surgeries: Open partial colectomy and open cholecystectomy
07/23/2019.

Chemotherapy: Yes; Ongoing? No; Most recent administration:
10/25/2019

Immunotherapy? No

Radiation therapy? No

EXAM:
CT CHEST, ABDOMEN, AND PELVIS WITH CONTRAST
FINDINGS: CT CHEST FINDINGS

Cardiovascular: No significant vascular findings. Normal heart size.
No pericardial effusion.

Mediastinum/Nodes: No axillary or supraclavicular adenopathy. No
mediastinal or hilar adenopathy. No pericardial fluid. Esophagus
normal.

Lungs/Pleura: No suspicious pulmonary nodules.  Airways

Musculoskeletal: No aggressive osseous lesion.

CT ABDOMEN AND PELVIS FINDINGS

Hepatobiliary: No focal hepatic lesion. No biliary ductal
dilatation. Gallbladder is normal. Common bile duct is normal.

Pancreas: Pancreas is normal. No ductal dilatation. No pancreatic
inflammation.

Spleen: Two small hypodense lesion in the spleen are unchanged
likely benign cysts or hemangiomas.

Adrenals/urinary tract: Adrenal glands and kidneys are normal. The
ureters and bladder normal.

Stomach/Bowel: Stomach, duodenum and small-bowel are normal. Post
RIGHT hemicolectomy. No evidence of local recurrence at anastomosis.
LEFT colon and rectosigmoid normal. Moderate volume stool in the.

Vascular/Lymphatic: Abdominal aorta is normal caliber. There is no
retroperitoneal or periportal lymphadenopathy. No pelvic
lymphadenopathy.

Reproductive: Prostate unremarkable

Other: No peritoneal nodularity. Mesenteric metastasis.
Retroperitoneal nodularity.

Musculoskeletal: Several punctate sclerotic lesions in the pelvis
and femoral heads unchanged from prior and favored benign bone
islands.
IMPRESSION: Chest Impression:

No evidence of thoracic metastasis

Abdomen / Pelvis Impression:

1. Post RIGHT hemicolectomy. No evidence of local colorectal
carcinoma recurrence.
2. No evidence visceral metastasis lymphadenopathy.
3. Small sclerotic lesions in the pelvis are favored benign.

## 2021-05-09 IMAGING — CT CT CHEST W/ CM
2 of 5 series · 14 of 46 positions shown, 16 images · IV contrast (iopamidol)
Comparison: Most recent CT abdomen and pelvis 05/30/2020. CT chest
07/19/2019.

CLINICAL DATA: Primary Cancer Type: Colon
TECHNIQUE: Multidetector CT imaging of the chest, abdomen and pelvis was
performed following the standard protocol during bolus
administration of intravenous contrast.

CONTRAST:  100mL 0AFBLD-7RR IOPAMIDOL (0AFBLD-7RR) INJECTION 61%

[Series 2: cap with 5.00 br40 s3 axial · axial · 0.83mm/px · z∈[+1301,+1881]mm · 11 of 140 slices shown, 13 images]
[im 12/140  soft-tissue]
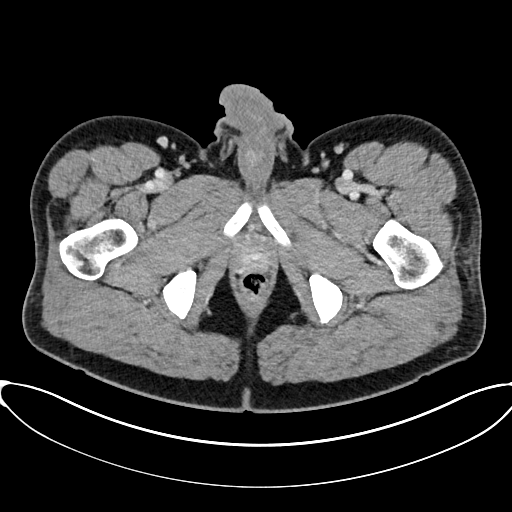
[im 12/140  bone]
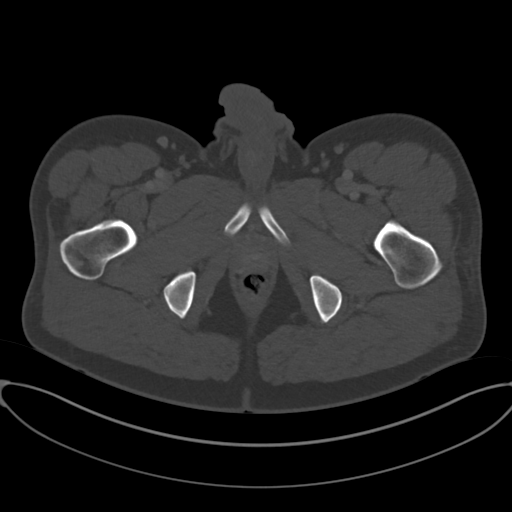
[im 24/140  soft-tissue]
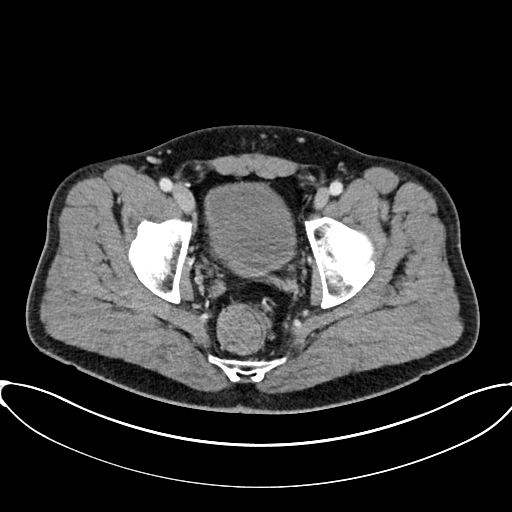
[im 35/140  soft-tissue]
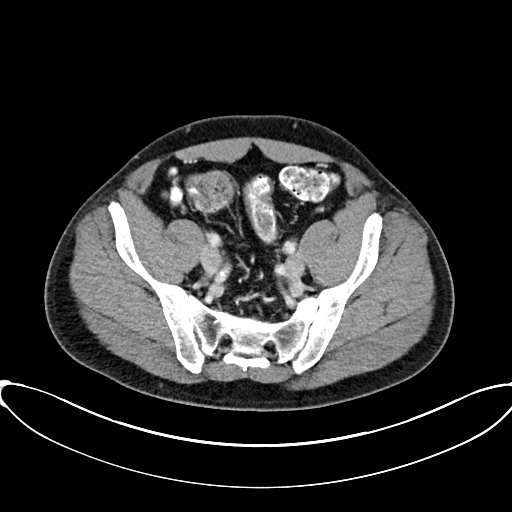
[im 47/140  soft-tissue]
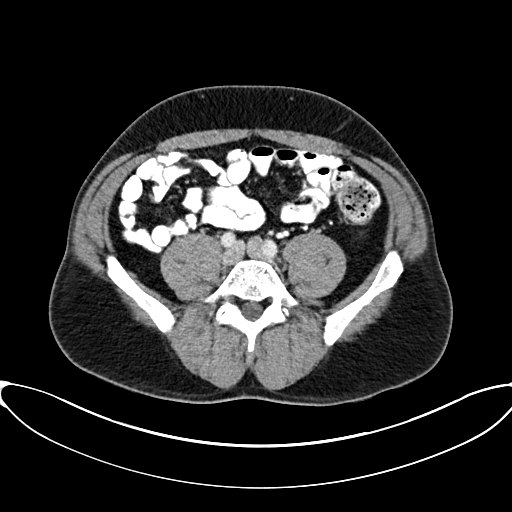
[im 58/140  soft-tissue]
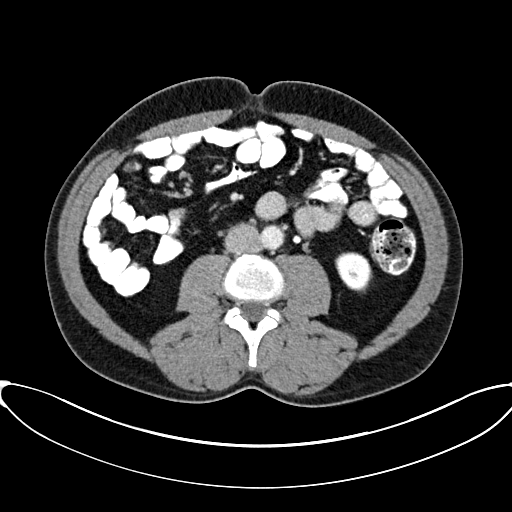
[im 70/140  soft-tissue]
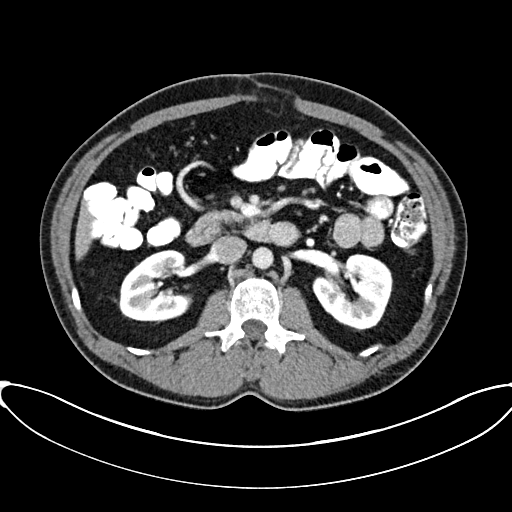
[im 82/140  soft-tissue]
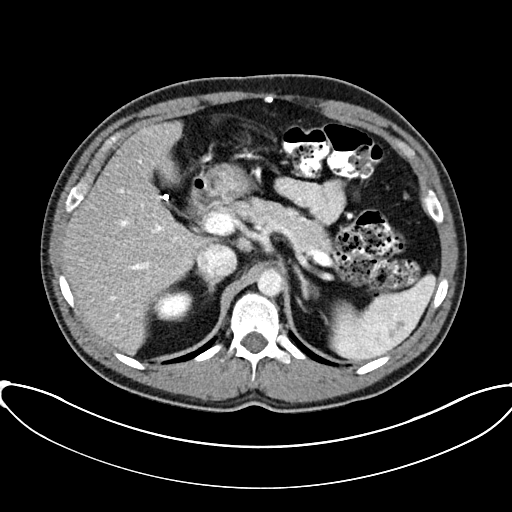
[im 93/140  soft-tissue]
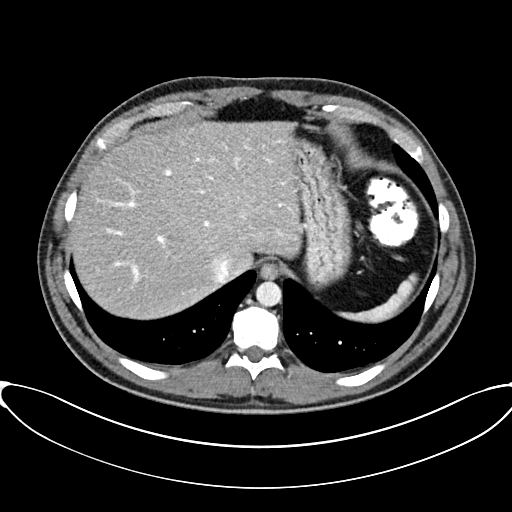
[im 105/140  soft-tissue]
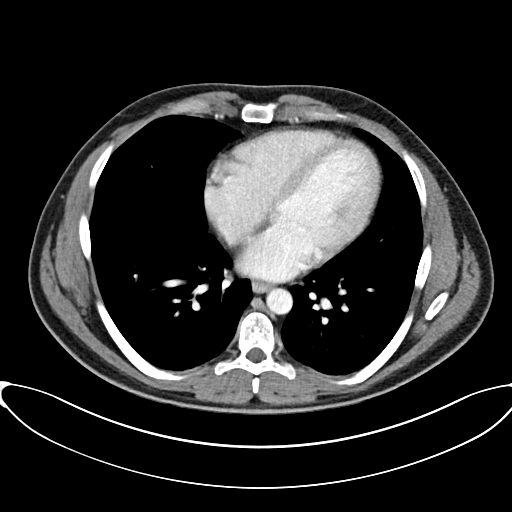
[im 105/140  bone]
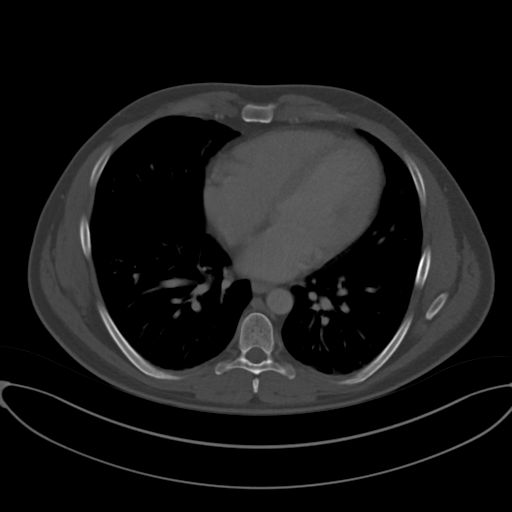
[im 116/140  soft-tissue]
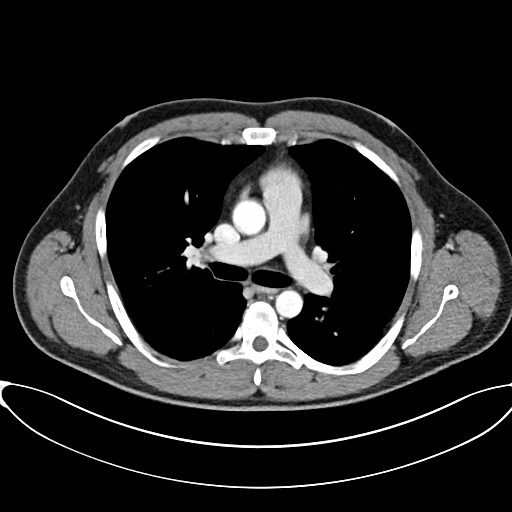
[im 128/140  soft-tissue]
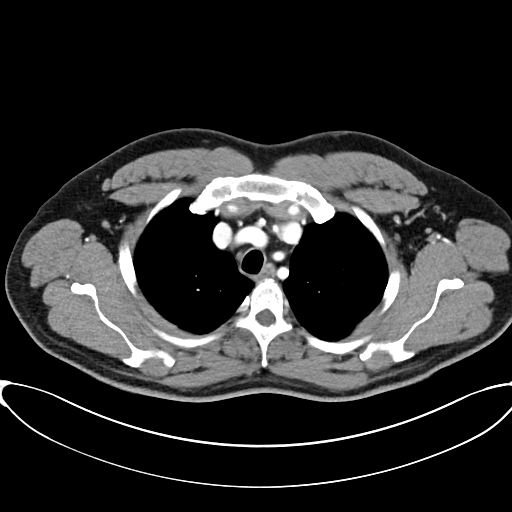

[Series 6: cap with 2.00 br40 s3 cor · coronal · 0.83mm/px · 3 of 211 slices shown]
[im 71/211  soft-tissue]
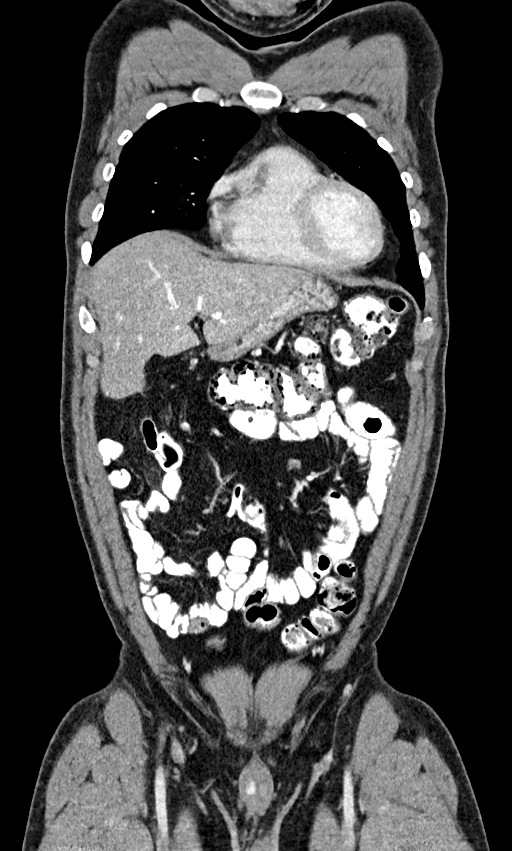
[im 94/211  soft-tissue]
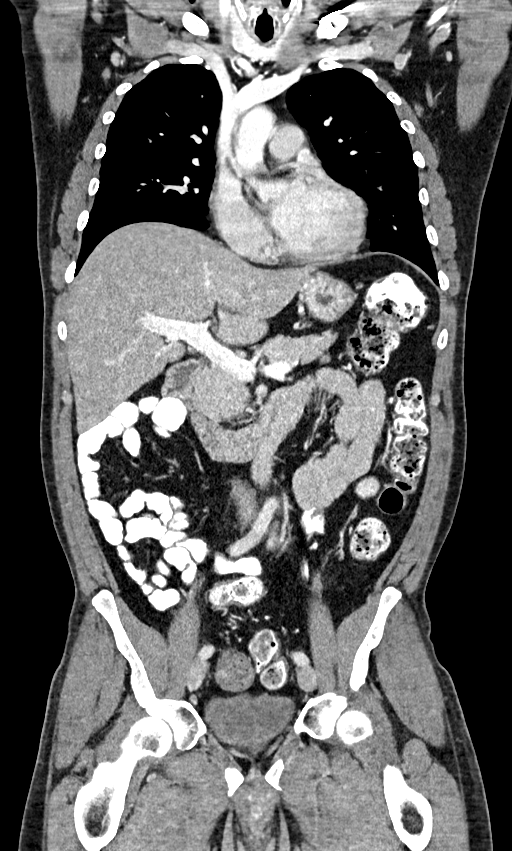
[im 117/211  soft-tissue]
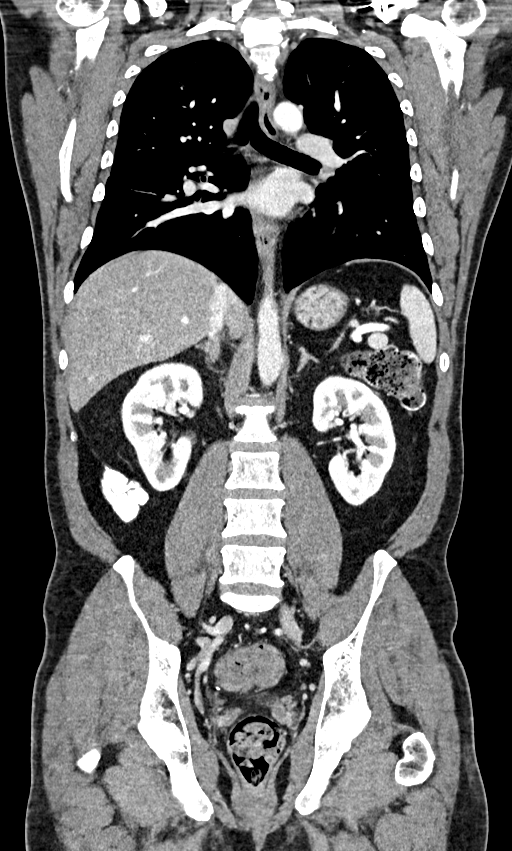

[14 of 46 positions shown; findings below may reference images not displayed]

Imaging Indication: Routine surveillance

Interval therapy since last imaging? No

Initial Cancer Diagnosis

Date: 07/23/2019; Established by: Biopsy-proven

Detailed Pathology: Adenocarcinoma of the ascending colon, stage
IIb.

Primary Tumor location: Colon ascending at hepatic flexure

Surgeries: Open partial colectomy and open cholecystectomy
07/23/2019.

Chemotherapy: Yes; Ongoing? No; Most recent administration:
10/25/2019

Immunotherapy? No

Radiation therapy? No

EXAM:
CT CHEST, ABDOMEN, AND PELVIS WITH CONTRAST
FINDINGS: CT CHEST FINDINGS

Cardiovascular: No significant vascular findings. Normal heart size.
No pericardial effusion.

Mediastinum/Nodes: No axillary or supraclavicular adenopathy. No
mediastinal or hilar adenopathy. No pericardial fluid. Esophagus
normal.

Lungs/Pleura: No suspicious pulmonary nodules.  Airways

Musculoskeletal: No aggressive osseous lesion.

CT ABDOMEN AND PELVIS FINDINGS

Hepatobiliary: No focal hepatic lesion. No biliary ductal
dilatation. Gallbladder is normal. Common bile duct is normal.

Pancreas: Pancreas is normal. No ductal dilatation. No pancreatic
inflammation.

Spleen: Two small hypodense lesion in the spleen are unchanged
likely benign cysts or hemangiomas.

Adrenals/urinary tract: Adrenal glands and kidneys are normal. The
ureters and bladder normal.

Stomach/Bowel: Stomach, duodenum and small-bowel are normal. Post
RIGHT hemicolectomy. No evidence of local recurrence at anastomosis.
LEFT colon and rectosigmoid normal. Moderate volume stool in the.

Vascular/Lymphatic: Abdominal aorta is normal caliber. There is no
retroperitoneal or periportal lymphadenopathy. No pelvic
lymphadenopathy.

Reproductive: Prostate unremarkable

Other: No peritoneal nodularity. Mesenteric metastasis.
Retroperitoneal nodularity.

Musculoskeletal: Several punctate sclerotic lesions in the pelvis
and femoral heads unchanged from prior and favored benign bone
islands.
IMPRESSION: Chest Impression:

No evidence of thoracic metastasis

Abdomen / Pelvis Impression:

1. Post RIGHT hemicolectomy. No evidence of local colorectal
carcinoma recurrence.
2. No evidence visceral metastasis lymphadenopathy.
3. Small sclerotic lesions in the pelvis are favored benign.

## 2021-07-09 ENCOUNTER — Other Ambulatory Visit: Payer: Self-pay | Admitting: *Deleted

## 2021-07-09 ENCOUNTER — Encounter: Payer: Self-pay | Admitting: *Deleted

## 2021-07-09 DIAGNOSIS — C182 Malignant neoplasm of ascending colon: Secondary | ICD-10-CM

## 2021-07-09 NOTE — Progress Notes (Signed)
Unable to reach patient for CT appointment information (mail box was full). Left VM for wife, Vernie Shanks to have him look on his Mychart account for appointment information and to please confirm receipt.

## 2021-07-22 ENCOUNTER — Inpatient Hospital Stay: Payer: Managed Care, Other (non HMO) | Attending: Family Medicine

## 2021-07-22 ENCOUNTER — Ambulatory Visit (HOSPITAL_BASED_OUTPATIENT_CLINIC_OR_DEPARTMENT_OTHER): Admission: RE | Admit: 2021-07-22 | Payer: Managed Care, Other (non HMO) | Source: Ambulatory Visit

## 2021-07-23 ENCOUNTER — Inpatient Hospital Stay: Payer: Managed Care, Other (non HMO) | Admitting: Oncology

## 2021-07-23 ENCOUNTER — Encounter: Payer: Self-pay | Admitting: *Deleted

## 2021-07-23 NOTE — Progress Notes (Signed)
Unsuccessful reaching patient via VM or MyChart in the past. Sent letter to home to reschedule.

## 2021-07-24 ENCOUNTER — Ambulatory Visit: Payer: Managed Care, Other (non HMO) | Admitting: Oncology

## 2021-11-03 ENCOUNTER — Other Ambulatory Visit: Payer: Self-pay

## 2021-11-03 ENCOUNTER — Inpatient Hospital Stay: Payer: Managed Care, Other (non HMO) | Attending: Oncology

## 2021-11-03 DIAGNOSIS — Z85038 Personal history of other malignant neoplasm of large intestine: Secondary | ICD-10-CM | POA: Diagnosis present

## 2021-11-03 DIAGNOSIS — C182 Malignant neoplasm of ascending colon: Secondary | ICD-10-CM

## 2021-11-03 LAB — BASIC METABOLIC PANEL - CANCER CENTER ONLY
Anion gap: 7 (ref 5–15)
BUN: 16 mg/dL (ref 6–20)
CO2: 27 mmol/L (ref 22–32)
Calcium: 9.1 mg/dL (ref 8.9–10.3)
Chloride: 103 mmol/L (ref 98–111)
Creatinine: 0.89 mg/dL (ref 0.61–1.24)
GFR, Estimated: >60 mL/min (ref >60)
Glucose, Bld: 107 mg/dL — ABNORMAL HIGH (ref 70–99)
Potassium: 4.6 mmol/L (ref 3.5–5.1)
Sodium: 137 mmol/L (ref 135–145)

## 2021-11-03 LAB — CEA (ACCESS): CEA (CHCC): 1.01 ng/mL (ref 0.00–5.00)

## 2021-11-04 ENCOUNTER — Ambulatory Visit (HOSPITAL_BASED_OUTPATIENT_CLINIC_OR_DEPARTMENT_OTHER)
Admission: RE | Admit: 2021-11-04 | Discharge: 2021-11-04 | Disposition: A | Discharge status: Home / Self Care | Payer: Managed Care, Other (non HMO) | Source: Ambulatory Visit | Attending: Oncology | Admitting: Oncology

## 2021-11-04 ENCOUNTER — Encounter (HOSPITAL_BASED_OUTPATIENT_CLINIC_OR_DEPARTMENT_OTHER): Payer: Self-pay

## 2021-11-04 DIAGNOSIS — C182 Malignant neoplasm of ascending colon: Secondary | ICD-10-CM | POA: Diagnosis not present

## 2021-11-04 MED ORDER — IOHEXOL 300 MG/ML  SOLN
85.0000 mL | Freq: Once | INTRAMUSCULAR | Status: AC | PRN
  Administered 2021-11-04: 85 mL via INTRAVENOUS

## 2021-11-10 ENCOUNTER — Ambulatory Visit: Payer: Managed Care, Other (non HMO) | Attending: Internal Medicine

## 2021-11-10 ENCOUNTER — Other Ambulatory Visit (HOSPITAL_BASED_OUTPATIENT_CLINIC_OR_DEPARTMENT_OTHER): Payer: Self-pay

## 2021-11-10 ENCOUNTER — Encounter: Payer: Self-pay | Admitting: Oncology

## 2021-11-10 ENCOUNTER — Inpatient Hospital Stay: Payer: Managed Care, Other (non HMO) | Admitting: Oncology

## 2021-11-10 ENCOUNTER — Other Ambulatory Visit: Payer: Self-pay

## 2021-11-10 VITALS — BP 125/79 | HR 48 | Temp 97.7°F | Resp 18 | Ht 71.0 in | Wt 210.8 lb

## 2021-11-10 DIAGNOSIS — Z23 Encounter for immunization: Secondary | ICD-10-CM

## 2021-11-10 DIAGNOSIS — Z85038 Personal history of other malignant neoplasm of large intestine: Secondary | ICD-10-CM | POA: Diagnosis not present

## 2021-11-10 DIAGNOSIS — C182 Malignant neoplasm of ascending colon: Secondary | ICD-10-CM

## 2021-11-10 MED ORDER — INFLUENZA VAC SPLIT QUAD 0.5 ML IM SUSY
PREFILLED_SYRINGE | INTRAMUSCULAR | 0 refills | Status: DC
  Filled 2021-11-10: qty 0.5, 1d supply, fill #0
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
## 2021-11-11 ENCOUNTER — Other Ambulatory Visit (HOSPITAL_BASED_OUTPATIENT_CLINIC_OR_DEPARTMENT_OTHER): Payer: Self-pay

## 2021-11-11 ENCOUNTER — Telehealth: Payer: Self-pay | Admitting: Oncology

## 2021-11-11 MED ORDER — PFIZER COVID-19 VAC BIVALENT 30 MCG/0.3ML IM SUSP
INTRAMUSCULAR | 0 refills | Status: DC
  Filled 2021-11-11: qty 0.3, 1d supply, fill #0

## 2021-11-11 NOTE — Telephone Encounter (Signed)
Called patient per 1/17 sch msg - no answer. Left message for patient to call back to schedule appt .

## 2021-11-11 NOTE — Progress Notes (Signed)
° °  Covid-19 Vaccination Clinic  Name:  RITIK STAVOLA    MRN: [id number] DOB: 12-15-1980  11/11/2021  Mr. Buchanon was observed post Covid-19 immunization for 15 minutes without incident. He was provided with Vaccine Information Sheet and instruction to access the V-Safe system.   Mr. Corning was instructed to call 911 with any severe reactions post vaccine: Difficulty breathing  Swelling of face and throat  A fast heartbeat  A bad rash all over body  Dizziness and weakness   Immunizations Administered     Name Date Dose VIS Date Route   Moderna Covid-19 vaccine Bivalent Booster 11/10/2021  1:37 PM 0.5 mL 06/06/2021 Intramuscular   Manufacturer: Moderna   Lot: [id number]   [place]: [id number]

## 2021-12-09 ENCOUNTER — Other Ambulatory Visit: Payer: Self-pay | Admitting: Genetic Counselor

## 2021-12-09 DIAGNOSIS — C182 Malignant neoplasm of ascending colon: Secondary | ICD-10-CM

## 2021-12-10 ENCOUNTER — Inpatient Hospital Stay: Payer: Managed Care, Other (non HMO)

## 2021-12-10 ENCOUNTER — Inpatient Hospital Stay: Payer: Managed Care, Other (non HMO) | Attending: Oncology | Admitting: Genetic Counselor

## 2021-12-10 NOTE — Addendum Note (Signed)
Addended by: Ignacia Bayley on: 12/10/2021 10:31 AM   Modules accepted: Orders

## 2022-02-08 ENCOUNTER — Telehealth: Payer: Self-pay | Admitting: Genetic Counselor

## 2022-02-08 NOTE — Telephone Encounter (Signed)
R/s pt's genetics appt per 4/17 staff msg from Roma Kayser. Pt is aware of appt date and time.  ?

## 2022-03-08 ENCOUNTER — Encounter: Payer: Self-pay | Admitting: Genetic Counselor

## 2022-03-09 ENCOUNTER — Encounter: Payer: Self-pay | Admitting: Genetic Counselor

## 2022-03-09 ENCOUNTER — Other Ambulatory Visit: Payer: Self-pay | Admitting: Genetic Counselor

## 2022-03-09 DIAGNOSIS — C182 Malignant neoplasm of ascending colon: Secondary | ICD-10-CM

## 2022-03-10 ENCOUNTER — Inpatient Hospital Stay: Payer: Managed Care, Other (non HMO) | Attending: Oncology | Admitting: Genetic Counselor

## 2022-03-10 ENCOUNTER — Inpatient Hospital Stay: Payer: Managed Care, Other (non HMO)

## 2022-03-17 ENCOUNTER — Emergency Department (HOSPITAL_BASED_OUTPATIENT_CLINIC_OR_DEPARTMENT_OTHER): Payer: Managed Care, Other (non HMO) | Admitting: Radiology

## 2022-03-17 ENCOUNTER — Other Ambulatory Visit: Payer: Self-pay

## 2022-03-17 ENCOUNTER — Encounter (HOSPITAL_BASED_OUTPATIENT_CLINIC_OR_DEPARTMENT_OTHER): Payer: Self-pay

## 2022-03-17 DIAGNOSIS — Y9355 Activity, bike riding: Secondary | ICD-10-CM | POA: Diagnosis not present

## 2022-03-17 DIAGNOSIS — S8991XA Unspecified injury of right lower leg, initial encounter: Secondary | ICD-10-CM | POA: Diagnosis present

## 2022-03-17 DIAGNOSIS — S81011A Laceration without foreign body, right knee, initial encounter: Secondary | ICD-10-CM | POA: Insufficient documentation

## 2022-03-17 DIAGNOSIS — Z23 Encounter for immunization: Secondary | ICD-10-CM | POA: Diagnosis not present

## 2022-03-17 DIAGNOSIS — S50311A Abrasion of right elbow, initial encounter: Secondary | ICD-10-CM | POA: Insufficient documentation

## 2022-03-17 MED ORDER — ACETAMINOPHEN 325 MG PO TABS
650.0000 mg | ORAL_TABLET | Freq: Once | ORAL | Status: AC
  Administered 2022-03-17: 650 mg via ORAL
  Filled 2022-03-17: qty 2
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
## 2022-03-18 ENCOUNTER — Emergency Department (HOSPITAL_BASED_OUTPATIENT_CLINIC_OR_DEPARTMENT_OTHER)
Admission: EM | Admit: 2022-03-18 | Discharge: 2022-03-18 | Disposition: A | Discharge status: Home / Self Care | Payer: Managed Care, Other (non HMO) | Attending: Emergency Medicine | Admitting: Emergency Medicine

## 2022-03-18 DIAGNOSIS — S50311A Abrasion of right elbow, initial encounter: Secondary | ICD-10-CM

## 2022-03-18 DIAGNOSIS — S81011A Laceration without foreign body, right knee, initial encounter: Secondary | ICD-10-CM

## 2022-03-18 MED ORDER — OXYCODONE-ACETAMINOPHEN 5-325 MG PO TABS
2.0000 | ORAL_TABLET | Freq: Once | ORAL | Status: AC
  Administered 2022-03-18: 2 via ORAL
  Filled 2022-03-18: qty 2

## 2022-03-18 MED ORDER — LIDOCAINE HCL 2 % IJ SOLN
10.0000 mL | Freq: Once | INTRAMUSCULAR | Status: DC
  Filled 2022-03-18: qty 20

## 2022-03-18 MED ORDER — LIDOCAINE-EPINEPHRINE 2 %-1:200000 IJ SOLN
20.0000 mL | Freq: Once | INTRAMUSCULAR | Status: AC
Start: 2022-03-18 — End: 2022-03-18
  Administered 2022-03-18: 20 mL
  Filled 2022-03-18: qty 20

## 2022-03-18 MED ORDER — LIDOCAINE-EPINEPHRINE 2 %-1:100000 IJ SOLN: 20.0000 mL | Freq: Once | INTRAMUSCULAR | Status: DC

## 2022-03-18 MED ORDER — CEPHALEXIN 250 MG PO CAPS
1000.0000 mg | ORAL_CAPSULE | Freq: Once | ORAL | Status: AC
  Administered 2022-03-18: 1000 mg via ORAL
  Filled 2022-03-18: qty 4

## 2022-03-18 MED ORDER — CEPHALEXIN 500 MG PO CAPS: 500.0000 mg | ORAL_CAPSULE | Freq: Four times a day (QID) | ORAL | 0 refills | Status: DC

## 2022-03-18 MED ORDER — TETANUS-DIPHTH-ACELL PERTUSSIS 5-2.5-18.5 LF-MCG/0.5 IM SUSY
0.5000 mL | PREFILLED_SYRINGE | Freq: Once | INTRAMUSCULAR | Status: AC
Start: 2022-03-18 — End: 2022-03-18
  Administered 2022-03-18: 0.5 mL via INTRAMUSCULAR
  Filled 2022-03-18: qty 0.5
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
## 2022-03-18 NOTE — ED Notes (Addendum)
Pt verbalizes understanding of discharge instructions. Opportunity for questioning and answers were provided. Pt discharged from ED to home with wife. Pt declined discharge vital signs. A&Ox4 at time of discharge. Ambulatory from exam room.

## 2022-05-10 ENCOUNTER — Inpatient Hospital Stay: Payer: Managed Care, Other (non HMO) | Attending: Oncology

## 2022-05-10 ENCOUNTER — Ambulatory Visit: Payer: Managed Care, Other (non HMO) | Admitting: Nurse Practitioner

## 2022-05-10 ENCOUNTER — Encounter: Payer: Self-pay | Admitting: Nurse Practitioner

## 2022-05-10 ENCOUNTER — Inpatient Hospital Stay: Payer: Managed Care, Other (non HMO) | Admitting: Nurse Practitioner

## 2022-05-10 ENCOUNTER — Inpatient Hospital Stay: Payer: Managed Care, Other (non HMO)

## 2022-05-10 VITALS — BP 134/82 | HR 48 | Temp 98.1°F | Resp 18 | Ht 71.0 in | Wt 198.0 lb

## 2022-05-10 DIAGNOSIS — Z08 Encounter for follow-up examination after completed treatment for malignant neoplasm: Secondary | ICD-10-CM | POA: Insufficient documentation

## 2022-05-10 DIAGNOSIS — C182 Malignant neoplasm of ascending colon: Secondary | ICD-10-CM | POA: Diagnosis not present

## 2022-05-10 DIAGNOSIS — Z85038 Personal history of other malignant neoplasm of large intestine: Secondary | ICD-10-CM | POA: Insufficient documentation

## 2022-05-10 LAB — CBC WITH DIFFERENTIAL (CANCER CENTER ONLY)
Abs Immature Granulocytes: 0.01 10*3/uL (ref 0.00–0.07)
Basophils Absolute: 0 10*3/uL (ref 0.0–0.1)
Basophils Relative: 0 %
Eosinophils Absolute: 0.1 10*3/uL (ref 0.0–0.5)
Eosinophils Relative: 3 %
HCT: 44.6 % (ref 39.0–52.0)
Hemoglobin: 15.1 g/dL (ref 13.0–17.0)
Immature Granulocytes: 0 %
Lymphocytes Relative: 38 %
Lymphs Abs: 1.7 10*3/uL (ref 0.7–4.0)
MCH: 30.1 pg (ref 26.0–34.0)
MCHC: 33.9 g/dL (ref 30.0–36.0)
MCV: 89 fL (ref 80.0–100.0)
Monocytes Absolute: 0.6 10*3/uL (ref 0.1–1.0)
Monocytes Relative: 14 %
Neutro Abs: 2 10*3/uL (ref 1.7–7.7)
Neutrophils Relative %: 45 %
Platelet Count: 206 10*3/uL (ref 150–400)
RBC: 5.01 MIL/uL (ref 4.22–5.81)
RDW: 12.3 % (ref 11.5–15.5)
WBC Count: 4.5 10*3/uL (ref 4.0–10.5)
nRBC: 0 % (ref 0.0–0.2)

## 2022-05-10 LAB — CEA (ACCESS): CEA (CHCC): 1.46 ng/mL (ref 0.00–5.00)

## 2022-05-10 NOTE — Progress Notes (Signed)
  [place] OFFICE PROGRESS NOTE   Diagnosis: Colon cancer  INTERVAL HISTORY:   William Livingston returns as scheduled.  He feels well.  No change in bowel habits.  No blood or pain with bowel movements.  He has a good appetite.  He remains very active.  No residual neuropathy symptoms.  Objective:  Vital signs in last 24 hours:  Blood pressure 134/82, pulse (!) 48, temperature 98.1 F (36.7 C), temperature source Oral, resp. rate 18, height $RemoveBe'5\' 11"'QJMfMaxPi$  (1.803 m), weight 198 lb (89.8 kg), SpO2 100 %.    HEENT: No thrush or ulcers. Lymphatics: No palpable cervical, supraclavicular, axillary or inguinal lymph nodes. Resp: Lungs clear bilaterally. Cardio: Regular rate and rhythm. GI: No hepatosplenomegaly.  No mass. Vascular: No leg edema.   Lab Results:  Lab Results  Component Value Date   WBC 4.5 05/10/2022   HGB 15.1 05/10/2022   HCT 44.6 05/10/2022   MCV 89.0 05/10/2022   PLT 206 05/10/2022   NEUTROABS 2.0 05/10/2022    Imaging:  No results found.  Medications: I have reviewed the patient's current medications.  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), status post a right colectomy 07/23/2019 Grade 3, lymphovascular invasion present tumor invades visceral peritoneum, negative resection margins, 0/33 lymph nodes, no loss of mismatch repair protein expression, MSS Colonoscopy 07/19/2019-ascending colon mass, polyps removed from the sigmoid colon and rectum-tubular adenoma and hyperplastic polyp, biopsy of ascending colon mass-high-grade poorly differentiated adenocarcinoma CT abdomen/pelvis 07/17/2019-inflammatory appearing mass in the mid ascending colon with several prominent surrounding lymph nodes CT chest 07/19/2019-no evidence of metastatic disease, 3 mm perifissural nodule in the right lung likely a subpleural lymph node Cycle 1 CAPOX 08/23/2019 Cycle 2 CAPOX 09/13/2019 (oxaliplatin dose reduced and Emend added) Cycle 3 CAPOX  10/04/2019 Cycle 4 CAPOX 10/25/2019 (full dose oxaliplatin) CT abdomen/pelvis 05/30/2020-negative for recurrent disease CTs chest/abdomen/pelvis 08/06/2020- no metastatic disease CTs 11/04/2021-no evidence of metastatic disease History of iron deficiency anemia secondary to #1, resolved History left clavicle fracture Family history of breast and prostate cancer Vasectomy and vasectomy reversal Mild neutropenia secondary to chemotherapy-resolved  Disposition: William Livingston remains in clinical remission from colon cancer.  Plan for CEA and surveillance CT scans in 6 months.  He will be due for a surveillance colonoscopy in 2024.  He plans to contact the genetics counselor to reschedule his appointment.  We will see him in follow-up in 6 months, a few days after CT scans.    Ned Card ANP/GNP-BC   05/10/2022  11:35 AM

## 2022-05-17 ENCOUNTER — Other Ambulatory Visit: Payer: Self-pay

## 2022-11-10 ENCOUNTER — Ambulatory Visit (HOSPITAL_BASED_OUTPATIENT_CLINIC_OR_DEPARTMENT_OTHER)
Admission: RE | Admit: 2022-11-10 | Discharge: 2022-11-10 | Disposition: A | Discharge status: Home / Self Care | Payer: Managed Care, Other (non HMO) | Source: Ambulatory Visit | Attending: Nurse Practitioner | Admitting: Nurse Practitioner

## 2022-11-10 ENCOUNTER — Inpatient Hospital Stay: Payer: Managed Care, Other (non HMO) | Attending: Oncology

## 2022-11-10 ENCOUNTER — Other Ambulatory Visit: Payer: Managed Care, Other (non HMO)

## 2022-11-10 ENCOUNTER — Encounter (HOSPITAL_BASED_OUTPATIENT_CLINIC_OR_DEPARTMENT_OTHER): Payer: Self-pay

## 2022-11-10 DIAGNOSIS — C182 Malignant neoplasm of ascending colon: Secondary | ICD-10-CM | POA: Diagnosis present

## 2022-11-10 DIAGNOSIS — Z85038 Personal history of other malignant neoplasm of large intestine: Secondary | ICD-10-CM | POA: Insufficient documentation

## 2022-11-10 DIAGNOSIS — Z08 Encounter for follow-up examination after completed treatment for malignant neoplasm: Secondary | ICD-10-CM | POA: Insufficient documentation

## 2022-11-10 LAB — BASIC METABOLIC PANEL - CANCER CENTER ONLY
Anion gap: 7 (ref 5–15)
BUN: 17 mg/dL (ref 6–20)
CO2: 31 mmol/L (ref 22–32)
Calcium: 9.7 mg/dL (ref 8.9–10.3)
Chloride: 99 mmol/L (ref 98–111)
Creatinine: 0.9 mg/dL (ref 0.61–1.24)
GFR, Estimated: >60 mL/min (ref >60)
Glucose, Bld: 94 mg/dL (ref 70–99)
Potassium: 4.2 mmol/L (ref 3.5–5.1)
Sodium: 137 mmol/L (ref 135–145)

## 2022-11-10 LAB — CEA (ACCESS): CEA (CHCC): 1.22 ng/mL (ref 0.00–5.00)

## 2022-11-10 MED ORDER — IOHEXOL 300 MG/ML  SOLN
100.0000 mL | Freq: Once | INTRAMUSCULAR | Status: AC | PRN
  Administered 2022-11-10: 85 mL via INTRAVENOUS

## 2022-11-12 ENCOUNTER — Inpatient Hospital Stay: Payer: Managed Care, Other (non HMO) | Admitting: Oncology

## 2022-11-12 VITALS — BP 129/87 | HR 48 | Temp 98.2°F | Resp 18 | Ht 71.0 in | Wt 203.6 lb

## 2022-11-12 DIAGNOSIS — Z08 Encounter for follow-up examination after completed treatment for malignant neoplasm: Secondary | ICD-10-CM | POA: Diagnosis present

## 2022-11-12 DIAGNOSIS — C182 Malignant neoplasm of ascending colon: Secondary | ICD-10-CM

## 2022-11-12 DIAGNOSIS — Z85038 Personal history of other malignant neoplasm of large intestine: Secondary | ICD-10-CM | POA: Diagnosis present

## 2022-11-12 NOTE — Progress Notes (Signed)
[facility] OFFICE PROGRESS NOTE   Diagnosis: Colon cancer  INTERVAL HISTORY:   [facility] Livingston returns as scheduled.  He feels well.  Good appetite.  No neuropathy symptoms.  No difficulty with bowel function.  No bleeding.  Objective:  Vital signs in last 24 hours:  Blood pressure 129/87, pulse 98, temperature 98.2 F (36.8 C), resp. rate 18, height '5\' 11"'$  (1.803 m), weight 203 lb 9.6 oz (92.4 kg), SpO2 98 %.     Lymphatics: No cervical, supraclavicular, axillary, or inguinal nodes Resp: Lungs clear bilaterally Cardio: Regular rate and rhythm GI: No hepatosplenomegaly, no mass, nontender Vascular: No leg edema  Lab Results:  Lab Results  Component Value Date   WBC 4.5 05/10/2022   HGB 15.1 05/10/2022   HCT 44.6 05/10/2022   MCV 89.0 05/10/2022   PLT 206 05/10/2022   NEUTROABS 2.0 05/10/2022    CMP  Lab Results  Component Value Date   NA 137 11/10/2022   K 4.2 11/10/2022   CL 99 11/10/2022   CO2 31 11/10/2022   GLUCOSE 94 11/10/2022   BUN 17 11/10/2022   CREATININE 0.90 11/10/2022   CALCIUM 9.7 11/10/2022   PROT 7.0 03/25/2020   ALBUMIN 3.9 03/25/2020   AST 20 03/25/2020   ALT 18 03/25/2020   ALKPHOS 92 03/25/2020   BILITOT 0.7 03/25/2020   GFRNONAA >60 11/10/2022   GFRAA >60 07/21/2020    Lab Results  Component Value Date   CEA1 1.28 03/25/2021   CEA 1.22 11/10/2022     CT CHEST ABDOMEN PELVIS W CONTRAST  Result Date: 11/10/2022 CLINICAL DATA:  History of stage II/III colon cancer, monitor. * Tracking Code: BO * EXAM: CT CHEST, ABDOMEN, AND PELVIS WITH CONTRAST TECHNIQUE: Multidetector CT imaging of the chest, abdomen and pelvis was performed following the standard protocol during bolus administration of intravenous contrast. RADIATION DOSE REDUCTION: This exam was performed according to the departmental dose-optimization program which includes automated exposure control, adjustment of the mA and/or kV according to patient size and/or  use of iterative reconstruction technique. CONTRAST:  74m OMNIPAQUE IOHEXOL 300 MG/ML  SOLN COMPARISON:  Priors including most recent CT November 04, 2021 FINDINGS: CT CHEST FINDINGS Cardiovascular: Normal caliber thoracic aorta. No central pulmonary embolus on this nondedicated study. Normal size heart. No significant pericardial effusion/thickening. Mediastinum/Nodes: No supraclavicular adenopathy. Hypoattenuating 12 mm right thyroid nodule. Not clinically significant; no follow-up imaging recommended (ref: J Am Coll Radiol. 2015 Feb;12(2): 143-50).No pathologically enlarged mediastinal, hilar or axillary lymph nodes. Esophagus is grossly unremarkable. Lungs/Pleura: No suspicious pulmonary nodules or masses. Hypoventilatory change in the dependent lungs. No pleural effusion. No pneumothorax. Musculoskeletal: Left clavicular surgical fixation hardware. Similar appearance of the sclerotic focus in the right humeral head unchanged dating back to at least July 19, 2019 compatible with a benign finding. No aggressive lytic or blastic lesion of bone. Multilevel degenerative change of the spine. Degenerative change of the bilateral shoulders with calcific tendinosis of the right supraspinatus muscle. CT ABDOMEN PELVIS FINDINGS Hepatobiliary: No suspicious hepatic lesion. Gallbladder is surgically absent. No biliary ductal dilation. Pancreas: No pancreatic ductal dilation or evidence of acute inflammation. Spleen: Stable subcentimeter hypodense splenic lesions are technically too small to accurately characterize but statistically likely reflect benign lymphangiomas or hemangiomas and requiring no imaging follow-up. Adrenals/Urinary Tract: Bilateral adrenal glands appear normal. Hydronephrosis. Kidneys demonstrate symmetric enhancement. Urinary bladder is unremarkable for degree of distension. Stomach/Bowel: Stomach is unremarkable for degree of distension. Pathologic dilation of small or large bowel.  Similar surgical  changes of right hemicolectomy with ileocolonic anastomotic sutures in the midline abdomen on image 73/4. No suspicious enhancing soft tissue along the suture line. Moderate volume of formed stool in the colon suggestive of constipation. Vascular/Lymphatic: Normal caliber abdominal aorta. Smooth IVC contours. No pathologically enlarged abdominal or pelvic lymph nodes. Reproductive: Prostate is unremarkable.  Bilateral varicoceles. Other: No significant abdominopelvic free fluid. No discrete peritoneal or omental nodularity. Musculoskeletal: No aggressive lytic or blastic lesion of bone. Limbus type L4 vertebral body. Benign bone islands in the bilateral femoral heads and left acetabulum. Similar loss of sphericity of the bilateral femoral heads with cystic change along the femoral head neck junction, osseous morphology which may predispose to femoroacetabular impingement (FAI) in the appropriate clinical setting IMPRESSION: 1. Stable surgical changes of right hemicolectomy without evidence of local recurrence or metastatic disease within the chest, abdomen, or pelvis. 2. Moderate volume of formed stool in the colon suggestive of constipation. 3. Bilateral varicoceles. 4. Similar loss of sphericity of the bilateral femoral heads with cystic change along the femoral head neck junction, osseous morphology which may predispose to femoroacetabular impingement (FAI) in the appropriate clinical setting. Electronically Signed   By: Dahlia Bailiff M.D.   On: 11/10/2022 09:56    Medications: I have reviewed the patient's current medications.   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), status post a right colectomy 07/23/2019 Grade 3, lymphovascular invasion present tumor invades visceral peritoneum, negative resection margins, 0/33 lymph nodes, no loss of mismatch repair protein expression, MSS Colonoscopy 07/19/2019-ascending colon mass, polyps removed from the sigmoid colon and  rectum-tubular adenoma and hyperplastic polyp, biopsy of ascending colon mass-high-grade poorly differentiated adenocarcinoma CT abdomen/pelvis 07/17/2019-inflammatory appearing mass in the mid ascending colon with several prominent surrounding lymph nodes CT chest 07/19/2019-no evidence of metastatic disease, 3 mm perifissural nodule in the right lung likely a subpleural lymph node Cycle 1 CAPOX 08/23/2019 Cycle 2 CAPOX 09/13/2019 (oxaliplatin dose reduced and Emend added) Cycle 3 CAPOX 10/04/2019 Cycle 4 CAPOX 10/25/2019 (full dose oxaliplatin) CT abdomen/pelvis 05/30/2020-negative for recurrent disease Colonoscopy 12/31/2019-polyp removed from the sigmoid colon-hyperplastic polyp CTs chest/abdomen/pelvis 08/06/2020- no metastatic disease CTs 11/04/2021-no evidence of metastatic disease CTs 11/10/2022-no evidence of metastatic disease, loss of sphericity of the bilateral femoral heads with cystic change at the femoral head neck junction History of iron deficiency anemia secondary to #1, resolved History left clavicle fracture Family history of breast and prostate cancer Vasectomy and vasectomy reversal Mild neutropenia secondary to chemotherapy-resolved    Disposition: [facility] Livingston is in clinical remission from colon cancer.  He will return for an office visit and CEA in 6 months.  He will schedule a surveillance colonoscopy with Dr. Rosalie Gums.  He will discuss the femoral head/neck findings with Dr. Melford Aase.  He denies hip pain.  Betsy Coder, MD  11/12/2022  9:27 AM

## 2022-11-15 ENCOUNTER — Encounter: Payer: Self-pay | Admitting: *Deleted

## 2022-11-15 NOTE — Progress Notes (Signed)
Faxed referral order, office note, med list to [facility] [phone]. Patient is due colonoscopy.

## 2022-12-23 ENCOUNTER — Telehealth: Payer: Self-pay | Admitting: *Deleted

## 2022-12-23 NOTE — Telephone Encounter (Signed)
Call to [facility] to f/u on status of referral for colonoscopy with Dr. Alessandra Bevels. Was informed he is on the "call list" and a letter was mailed to his home to call office to schedule. Sent [facility] Livingston a [employer] to encourage him to call [facility].

## 2023-05-13 ENCOUNTER — Inpatient Hospital Stay: Payer: Managed Care, Other (non HMO) | Admitting: Oncology

## 2023-05-13 ENCOUNTER — Inpatient Hospital Stay: Payer: Managed Care, Other (non HMO)

## 2023-06-09 ENCOUNTER — Inpatient Hospital Stay: Payer: Managed Care, Other (non HMO) | Admitting: Oncology

## 2023-06-09 ENCOUNTER — Inpatient Hospital Stay: Payer: Managed Care, Other (non HMO) | Attending: Family Medicine

## 2023-06-10 ENCOUNTER — Encounter: Payer: Self-pay | Admitting: *Deleted

## 2023-06-10 NOTE — Progress Notes (Signed)
"  No show" for 6 month lab/OV> Scheduling message sent to reschedule.

## 2023-06-27 ENCOUNTER — Ambulatory Visit
Admission: EM | Admit: 2023-06-27 | Discharge: 2023-06-27 | Disposition: A | Discharge status: Home / Self Care | Payer: Managed Care, Other (non HMO) | Attending: Internal Medicine | Admitting: Internal Medicine

## 2023-06-27 ENCOUNTER — Ambulatory Visit (INDEPENDENT_AMBULATORY_CARE_PROVIDER_SITE_OTHER): Payer: Managed Care, Other (non HMO)

## 2023-06-27 DIAGNOSIS — S51811A Laceration without foreign body of right forearm, initial encounter: Secondary | ICD-10-CM | POA: Diagnosis not present

## 2023-06-27 DIAGNOSIS — M25521 Pain in right elbow: Secondary | ICD-10-CM | POA: Diagnosis not present

## 2023-06-27 NOTE — Discharge Instructions (Signed)
Keep area clean and dry.  Monitor for signs of infection which include and limited to redness, drainage, warmth, fevers and chills and seek reevaluation if these occur.  Return to clinic in 10 days for suture removal.  I hope you feel better soon!
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
## 2023-07-21 ENCOUNTER — Telehealth: Payer: Self-pay

## 2023-07-21 ENCOUNTER — Inpatient Hospital Stay: Payer: Managed Care, Other (non HMO)

## 2023-07-21 ENCOUNTER — Inpatient Hospital Stay: Payer: Managed Care, Other (non HMO) | Admitting: Oncology

## 2023-07-21 ENCOUNTER — Inpatient Hospital Stay: Payer: Managed Care, Other (non HMO) | Attending: Oncology

## 2023-07-21 VITALS — BP 129/79 | HR 61 | Temp 98.2°F | Resp 18 | Ht 71.0 in | Wt 206.0 lb

## 2023-07-21 DIAGNOSIS — Z85038 Personal history of other malignant neoplasm of large intestine: Secondary | ICD-10-CM | POA: Insufficient documentation

## 2023-07-21 DIAGNOSIS — C182 Malignant neoplasm of ascending colon: Secondary | ICD-10-CM

## 2023-07-21 DIAGNOSIS — Z23 Encounter for immunization: Secondary | ICD-10-CM | POA: Insufficient documentation

## 2023-07-21 LAB — CEA (ACCESS): CEA (CHCC): 1.35 ng/mL (ref 0.00–5.00)

## 2023-07-21 MED ORDER — INFLUENZA VIRUS VACC SPLIT PF (FLUZONE) 0.5 ML IM SUSY
0.5000 mL | PREFILLED_SYRINGE | Freq: Once | INTRAMUSCULAR | Status: AC
  Administered 2023-07-21: 0.5 mL via INTRAMUSCULAR
  Filled 2023-07-21: qty 0.5

## 2023-07-21 NOTE — Telephone Encounter (Signed)
-----   Message from Thornton Papas sent at 07/21/2023  1:30 PM EDT ----- Please call patient, CEA is normal, follow-up as scheduled

## 2023-07-21 NOTE — Telephone Encounter (Signed)
Patient gave verbal understanding and had no further questions or concerns  

## 2023-07-21 NOTE — Progress Notes (Signed)
  [facility] OFFICE PROGRESS NOTE   Diagnosis: Colon cancer  INTERVAL HISTORY:   [facility] Livingston returns as scheduled.  He feels well.  Good appetite and energy level.  No difficulty with bowel function.  No bleeding.  No complaint.  He underwent a colonoscopy earlier this year (we do not have the report available today).  Objective:  Vital signs in last 24 hours:  Blood pressure 129/79, pulse 61, temperature 98.2 F (36.8 C), temperature source Tympanic, resp. rate 18, height 5\' 11"  (1.803 m), weight 206 lb (93.4 kg), SpO2 99%.    Lymphatics: No cervical, supraclavicular, axillary, or inguinal nodes Resp: Lungs clear bilaterally Cardio: Regular rate and rhythm GI: No hepatosplenomegaly, no mass, nontender Vascular: No leg edema   Lab Results:  Lab Results  Component Value Date   WBC 4.5 05/10/2022   HGB 15.1 05/10/2022   HCT 44.6 05/10/2022   MCV 89.0 05/10/2022   PLT 206 05/10/2022   NEUTROABS 2.0 05/10/2022    CMP  Lab Results  Component Value Date   NA 137 11/10/2022   K 4.2 11/10/2022   CL 99 11/10/2022   CO2 31 11/10/2022   GLUCOSE 94 11/10/2022   BUN 17 11/10/2022   CREATININE 0.90 11/10/2022   CALCIUM 9.7 11/10/2022   PROT 7.0 03/25/2020   ALBUMIN 3.9 03/25/2020   AST 20 03/25/2020   ALT 18 03/25/2020   ALKPHOS 92 03/25/2020   BILITOT 0.7 03/25/2020   GFRNONAA >60 11/10/2022   GFRAA >60 07/21/2020    Lab Results  Component Value Date   CEA1 1.28 03/25/2021   CEA 1.22 11/10/2022    Medications: I have reviewed the patient's current medications.   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), status post a right colectomy 07/23/2019 Grade 3, lymphovascular invasion present tumor invades visceral peritoneum, negative resection margins, 0/33 lymph nodes, no loss of mismatch repair protein expression, MSS Colonoscopy 07/19/2019-ascending colon mass, polyps removed from the sigmoid colon and rectum-tubular  adenoma and hyperplastic polyp, biopsy of ascending colon mass-high-grade poorly differentiated adenocarcinoma CT abdomen/pelvis 07/17/2019-inflammatory appearing mass in the mid ascending colon with several prominent surrounding lymph nodes CT chest 07/19/2019-no evidence of metastatic disease, 3 mm perifissural nodule in the right lung likely a subpleural lymph node Cycle 1 CAPOX 08/23/2019 Cycle 2 CAPOX 09/13/2019 (oxaliplatin dose reduced and Emend added) Cycle 3 CAPOX 10/04/2019 Cycle 4 CAPOX 10/25/2019 (full dose oxaliplatin) CT abdomen/pelvis 05/30/2020-negative for recurrent disease Colonoscopy 12/31/2019-polyp removed from the sigmoid colon-hyperplastic polyp CTs chest/abdomen/pelvis 08/06/2020- no metastatic disease CTs 11/04/2021-no evidence of metastatic disease CTs 11/10/2022-no evidence of metastatic disease, loss of sphericity of the bilateral femoral heads with cystic change at the femoral head neck junction History of iron deficiency anemia secondary to #1, resolved History left clavicle fracture Family history of breast and prostate cancer Vasectomy and vasectomy reversal Mild neutropenia secondary to chemotherapy-resolved     Disposition: William Livingston is in clinical remission from colon cancer.  We will follow-up on the colonoscopy report from earlier this year.  We will follow-up on the CEA today.  He will return for an office visit and CEA in 6 months.  Thornton Papas, MD  07/21/2023  10:32 AM

## 2023-08-25 ENCOUNTER — Encounter: Payer: Self-pay | Admitting: Gastroenterology

## 2024-01-18 ENCOUNTER — Telehealth: Payer: Self-pay

## 2024-01-18 ENCOUNTER — Other Ambulatory Visit: Payer: Managed Care, Other (non HMO)

## 2024-01-18 ENCOUNTER — Encounter: Payer: Self-pay | Admitting: Nurse Practitioner

## 2024-01-18 ENCOUNTER — Inpatient Hospital Stay: Payer: Self-pay

## 2024-01-18 ENCOUNTER — Inpatient Hospital Stay: Payer: Managed Care, Other (non HMO) | Attending: Nurse Practitioner | Admitting: Nurse Practitioner

## 2024-01-18 ENCOUNTER — Telehealth: Payer: Self-pay | Admitting: Oncology

## 2024-01-18 VITALS — BP 137/86 | HR 51 | Temp 98.1°F | Resp 16 | Ht 71.0 in | Wt 204.0 lb

## 2024-01-18 DIAGNOSIS — Z85038 Personal history of other malignant neoplasm of large intestine: Secondary | ICD-10-CM | POA: Insufficient documentation

## 2024-01-18 DIAGNOSIS — Z9049 Acquired absence of other specified parts of digestive tract: Secondary | ICD-10-CM | POA: Insufficient documentation

## 2024-01-18 DIAGNOSIS — C182 Malignant neoplasm of ascending colon: Secondary | ICD-10-CM

## 2024-01-18 DIAGNOSIS — Z08 Encounter for follow-up examination after completed treatment for malignant neoplasm: Secondary | ICD-10-CM | POA: Insufficient documentation

## 2024-01-18 LAB — CEA (ACCESS): CEA (CHCC): 1.21 ng/mL (ref 0.00–5.00)

## 2024-01-18 NOTE — Telephone Encounter (Signed)
 Patient gave verbal understanding and had no further questions or concerns

## 2024-01-18 NOTE — Telephone Encounter (Signed)
 Left patient a vm regarding upcoming appointment

## 2024-01-18 NOTE — Progress Notes (Signed)
  [facility] OFFICE PROGRESS NOTE   Diagnosis: Colon cancer  INTERVAL HISTORY:   Mr. Cheatum returns as scheduled.  He feels well.  He remains very active.  He has a good appetite.  He denies neuropathy symptoms.  No change in bowel habits.  No blood or pain with bowel movements.  He noted a narrow stool on 1 occasion.  Objective:  Vital signs in last 24 hours:  Blood pressure 137/86, pulse (!) 51, temperature 98.1 F (36.7 C), temperature source Temporal, resp. rate 16, height 5\' 11"  (1.803 m), weight 204 lb (92.5 kg), SpO2 98%.     Lymphatics: No palpable cervical, supraclavicular, axillary or inguinal lymph nodes. Resp: Lungs clear bilaterally. Cardio: Regular rate and rhythm. GI: Abdomen soft and nontender.  No hepatosplenomegaly.  No mass. Vascular: No leg edema.    Lab Results:  Lab Results  Component Value Date   WBC 4.5 05/10/2022   HGB 15.1 05/10/2022   HCT 44.6 05/10/2022   MCV 89.0 05/10/2022   PLT 206 05/10/2022   NEUTROABS 2.0 05/10/2022    Imaging:  No results found.  Medications: I have reviewed the patient's current medications.  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), status post a right colectomy 07/23/2019 Grade 3, lymphovascular invasion present tumor invades visceral peritoneum, negative resection margins, 0/33 lymph nodes, no loss of mismatch repair protein expression, MSS Colonoscopy 07/19/2019-ascending colon mass, polyps removed from the sigmoid colon and rectum-tubular adenoma and hyperplastic polyp, biopsy of ascending colon mass-high-grade poorly differentiated adenocarcinoma CT abdomen/pelvis 07/17/2019-inflammatory appearing mass in the mid ascending colon with several prominent surrounding lymph nodes CT chest 07/19/2019-no evidence of metastatic disease, 3 mm perifissural nodule in the right lung likely a subpleural lymph node Cycle 1 CAPOX 08/23/2019 Cycle 2 CAPOX 09/13/2019 (oxaliplatin  dose reduced and Emend added) Cycle 3 CAPOX 10/04/2019 Cycle 4 CAPOX 10/25/2019 (full dose oxaliplatin) CT abdomen/pelvis 05/30/2020-negative for recurrent disease Colonoscopy 12/31/2019-polyp removed from the sigmoid colon-hyperplastic polyp CTs chest/abdomen/pelvis 08/06/2020- no metastatic disease CTs 11/04/2021-no evidence of metastatic disease CTs 11/10/2022-no evidence of metastatic disease, loss of sphericity of the bilateral femoral heads with cystic change at the femoral head neck junction Colonoscopy 03/31/2023-patent end-to-side ileocolonic anastomosis characterized by erythema and inflammation.  Patchy inflammation in the mid transverse colon, sigmoid colon and descending colon (diffuse active colitis negative for dysplasia; CMV immunostain negative).  Internal hemorrhoids. History of iron deficiency anemia secondary to #1, resolved History left clavicle fracture Family history of breast and prostate cancer Vasectomy and vasectomy reversal Mild neutropenia secondary to chemotherapy-resolved        Disposition: Mr. Galik appears stable.  He remains in clinical remission from colon cancer.  We will follow-up on the CEA from today.  He will return for a follow-up visit and CEA in 6 months.  He will continue colonoscopy surveillance with Dr. Georgiann Cocker.  At today's visit he reported a narrow stool on 1 occasion.  He understands to contact Dr. Georgiann Cocker with a consistent change in stool caliber.    Lonna Cobb ANP/GNP-BC   01/18/2024  11:07 AM

## 2024-01-18 NOTE — Telephone Encounter (Signed)
-----   Message from William Livingston sent at 01/18/2024  1:32 PM EDT ----- Please let him know when next colonoscopy is due and that CEA is stable in normal range.  Follow-up here as scheduled.

## 2024-01-18 NOTE — Telephone Encounter (Addendum)
 Called Dr. Kathi Der office, mutual patient,spoke with [facility] referral coordinator in regards to next scheduled Colonoscopy for this patient. Stated last completed in June 2024, next one will be done in 4 years. Will make provider aware.

## 2024-07-20 ENCOUNTER — Ambulatory Visit: Payer: Self-pay | Admitting: Oncology

## 2024-07-20 ENCOUNTER — Inpatient Hospital Stay

## 2024-07-20 ENCOUNTER — Inpatient Hospital Stay (HOSPITAL_BASED_OUTPATIENT_CLINIC_OR_DEPARTMENT_OTHER): Admitting: Oncology

## 2024-07-20 ENCOUNTER — Inpatient Hospital Stay: Attending: Oncology

## 2024-07-20 VITALS — BP 130/86 | HR 53 | Temp 97.8°F | Resp 18 | Ht 71.0 in | Wt 199.8 lb

## 2024-07-20 DIAGNOSIS — Z23 Encounter for immunization: Secondary | ICD-10-CM | POA: Insufficient documentation

## 2024-07-20 DIAGNOSIS — C182 Malignant neoplasm of ascending colon: Secondary | ICD-10-CM

## 2024-07-20 DIAGNOSIS — Z85038 Personal history of other malignant neoplasm of large intestine: Secondary | ICD-10-CM | POA: Insufficient documentation

## 2024-07-20 LAB — CEA (ACCESS): CEA (CHCC): 1.41 ng/mL (ref 0.00–5.00)

## 2024-07-20 MED ORDER — INFLUENZA VIRUS VACC SPLIT PF (FLUZONE) 0.5 ML IM SUSY
0.5000 mL | PREFILLED_SYRINGE | Freq: Once | INTRAMUSCULAR | Status: AC
  Administered 2024-07-20: 0.5 mL via INTRAMUSCULAR
  Filled 2024-07-20: qty 0.5

## 2024-07-20 NOTE — Progress Notes (Signed)
 [facility] OFFICE PROGRESS NOTE   Diagnosis: Colon cancer  INTERVAL HISTORY:   William Livingston returns for scheduled visit.  He feels well.  He reports intentional weight loss.  No difficulty with bowel function.  No bleeding.  No neuropathy symptoms.  He is exercising.  Objective:  Vital signs in last 24 hours:  Blood pressure 130/86, pulse (!) 53, temperature 97.8 F (36.6 C), temperature source Temporal, resp. rate 18, height 5' 11 (1.803 m), weight 199 lb 12.8 oz (90.6 kg), SpO2 98%.    Lymphatics: No cervical, supraclavicular, axillary, or left inguinal nodes.  Pea-sized lateral right inguinal node Resp: Lungs clear bilaterally Cardio: Regular rate and rhythm GI: Nontender, no mass, no hepatosplenomegaly, small incisional hernia Vascular: No leg edema  Lab Results:  Lab Results  Component Value Date   WBC 4.5 05/10/2022   HGB 15.1 05/10/2022   HCT 44.6 05/10/2022   MCV 89.0 05/10/2022   PLT 206 05/10/2022   NEUTROABS 2.0 05/10/2022    CMP  Lab Results  Component Value Date   NA 137 11/10/2022   K 4.2 11/10/2022   CL 99 11/10/2022   CO2 31 11/10/2022   GLUCOSE 94 11/10/2022   BUN 17 11/10/2022   CREATININE 0.90 11/10/2022   CALCIUM 9.7 11/10/2022   PROT 7.0 03/25/2020   ALBUMIN 3.9 03/25/2020   AST 20 03/25/2020   ALT 18 03/25/2020   ALKPHOS 92 03/25/2020   BILITOT 0.7 03/25/2020   GFRNONAA >60 11/10/2022   GFRAA >60 07/21/2020    Lab Results  Component Value Date   CEA1 1.28 03/25/2021   CEA 1.21 01/18/2024    Lab Results  Component Value Date   INR 1.1 10/29/2019   LABPROT 14.5 10/29/2019    Imaging:  No results found.  Medications: I have reviewed the patient's current medications.   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), status post a right colectomy 07/23/2019 Grade 3, lymphovascular invasion present tumor invades visceral peritoneum, negative resection margins, 0/33 lymph nodes,  no loss of mismatch repair protein expression, MSS Colonoscopy 07/19/2019-ascending colon mass, polyps removed from the sigmoid colon and rectum-tubular adenoma and hyperplastic polyp, biopsy of ascending colon mass-high-grade poorly differentiated adenocarcinoma CT abdomen/pelvis 07/17/2019-inflammatory appearing mass in the mid ascending colon with several prominent surrounding lymph nodes CT chest 07/19/2019-no evidence of metastatic disease, 3 mm perifissural nodule in the right lung likely a subpleural lymph node Cycle 1 CAPOX 08/23/2019 Cycle 2 CAPOX 09/13/2019 (oxaliplatin  dose reduced and Emend added) Cycle 3 CAPOX 10/04/2019 Cycle 4 CAPOX 10/25/2019 (full dose oxaliplatin ) CT abdomen/pelvis 05/30/2020-negative for recurrent disease Colonoscopy 12/31/2019-polyp removed from the sigmoid colon-hyperplastic polyp CTs chest/abdomen/pelvis 08/06/2020- no metastatic disease CTs 11/04/2021-no evidence of metastatic disease CTs 11/10/2022-no evidence of metastatic disease, loss of sphericity of the bilateral femoral heads with cystic change at the femoral head neck junction Colonoscopy 03/31/2023-patent end-to-side ileocolonic anastomosis characterized by erythema and inflammation.  Patchy inflammation in the mid transverse colon, sigmoid colon and descending colon (diffuse active colitis negative for dysplasia; CMV immunostain negative).  Internal hemorrhoids. History of iron deficiency anemia secondary to #1, resolved History left clavicle fracture Family history of breast and prostate cancer Vasectomy and vasectomy reversal Mild neutropenia secondary to chemotherapy-resolved      Disposition: William Livingston is in clinical remission from colon cancer.  He is now 5 years out from diagnosis.  He would like to continue follow-up at the Cancer center.  He will return for an office visit  and CEA in 1 year.  He continues colonoscopy surveillance with Dr. Elicia.  Colitis was noted on the colonoscopy in June  2024.  We will contact Dr. Elicia regarding the timing of the next colonoscopy. William Livingston received an influenza vaccine today.  Arley Hof, MD  07/20/2024  11:15 AM

## 2024-07-20 NOTE — Telephone Encounter (Signed)
 Patient gave verbal understanding and had no further questions.

## 2024-07-20 NOTE — Telephone Encounter (Signed)
-----   Message from Arley Hof sent at 07/20/2024  1:54 PM EDT ----- Please call patient , the CEA is normal, follow-up as scheduled  ----- Message ----- From: Debby Olam POUR, NP Sent: 07/20/2024  12:40 PM EDT To: Arley KATHEE Hof, MD   ----- Message ----- From: Rebecka, [facility] [place] Sent: 07/20/2024  11:27 AM EDT To: Olam POUR Debby, NP

## 2025-07-18 ENCOUNTER — Ambulatory Visit: Admitting: Oncology

## 2025-07-18 ENCOUNTER — Other Ambulatory Visit
